# Patient Record
Sex: Female | Born: 1985 | Race: White | Hispanic: No | Marital: Single | Smoking: Former smoker
Health system: Southern US, Community
[De-identification: ages and names within clinical notes are randomized; demographics above are authoritative.]

## PROBLEM LIST (undated history)

## (undated) DIAGNOSIS — F419 Anxiety disorder, unspecified: Secondary | ICD-10-CM

## (undated) DIAGNOSIS — F32A Depression, unspecified: Secondary | ICD-10-CM

## (undated) DIAGNOSIS — B1920 Unspecified viral hepatitis C without hepatic coma: Secondary | ICD-10-CM

## (undated) DIAGNOSIS — F329 Major depressive disorder, single episode, unspecified: Secondary | ICD-10-CM

## (undated) DIAGNOSIS — F909 Attention-deficit hyperactivity disorder, unspecified type: Secondary | ICD-10-CM

## (undated) DIAGNOSIS — T8484XA Pain due to internal orthopedic prosthetic devices, implants and grafts, initial encounter: Secondary | ICD-10-CM

## (undated) DIAGNOSIS — Z87898 Personal history of other specified conditions: Secondary | ICD-10-CM

## (undated) HISTORY — PX: KNEE ARTHROSCOPY: SHX127

---
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
## 2005-08-05 ENCOUNTER — Inpatient Hospital Stay (HOSPITAL_COMMUNITY): Admission: AD | Admit: 2005-08-05 | Discharge: 2005-08-05 | Payer: Self-pay | Admitting: Obstetrics and Gynecology

## 2009-01-24 ENCOUNTER — Ambulatory Visit (HOSPITAL_BASED_OUTPATIENT_CLINIC_OR_DEPARTMENT_OTHER): Admission: RE | Admit: 2009-01-24 | Discharge: 2009-01-24 | Payer: Self-pay | Admitting: Orthopedic Surgery

## 2009-01-24 HISTORY — PX: ORIF ANKLE FRACTURE: SUR919

## 2009-08-13 ENCOUNTER — Emergency Department (HOSPITAL_COMMUNITY): Admission: EM | Admit: 2009-08-13 | Discharge: 2009-08-13 | Payer: Self-pay | Admitting: Emergency Medicine

## 2010-01-08 ENCOUNTER — Emergency Department (HOSPITAL_COMMUNITY)
Admission: EM | Admit: 2010-01-08 | Discharge: 2010-01-08 | Payer: Self-pay | Source: Home / Self Care | Admitting: Emergency Medicine

## 2010-05-14 LAB — POCT HEMOGLOBIN-HEMACUE: Hemoglobin: 13.7 g/dL (ref 12.0–15.0)

## 2010-05-20 ENCOUNTER — Emergency Department (HOSPITAL_BASED_OUTPATIENT_CLINIC_OR_DEPARTMENT_OTHER)
Admission: EM | Admit: 2010-05-20 | Discharge: 2010-05-20 | Disposition: A | Discharge status: Home / Self Care | Payer: Medicaid Other | Attending: Emergency Medicine | Admitting: Emergency Medicine

## 2010-05-20 DIAGNOSIS — Y92009 Unspecified place in unspecified non-institutional (private) residence as the place of occurrence of the external cause: Secondary | ICD-10-CM | POA: Insufficient documentation

## 2010-05-20 DIAGNOSIS — S0100XA Unspecified open wound of scalp, initial encounter: Secondary | ICD-10-CM | POA: Insufficient documentation

## 2010-05-27 ENCOUNTER — Inpatient Hospital Stay (HOSPITAL_COMMUNITY)
Admission: AD | Admit: 2010-05-27 | Discharge: 2010-05-27 | Disposition: A | Discharge status: Home / Self Care | Payer: Medicaid Other | Source: Ambulatory Visit | Attending: Obstetrics & Gynecology | Admitting: Obstetrics & Gynecology

## 2010-05-27 DIAGNOSIS — B373 Candidiasis of vulva and vagina: Secondary | ICD-10-CM | POA: Insufficient documentation

## 2010-05-27 DIAGNOSIS — N949 Unspecified condition associated with female genital organs and menstrual cycle: Secondary | ICD-10-CM | POA: Insufficient documentation

## 2010-05-27 DIAGNOSIS — B3731 Acute candidiasis of vulva and vagina: Secondary | ICD-10-CM | POA: Insufficient documentation

## 2010-05-27 LAB — URINALYSIS, ROUTINE W REFLEX MICROSCOPIC
Hgb urine dipstick: NEGATIVE
Nitrite: NEGATIVE
Protein, ur: NEGATIVE mg/dL
Specific Gravity, Urine: 1.015 (ref 1.005–1.030)
Urobilinogen, UA: 1 mg/dL (ref 0.0–1.0)

## 2010-05-27 LAB — POCT PREGNANCY, URINE: Preg Test, Ur: NEGATIVE

## 2010-05-27 LAB — WET PREP, GENITAL: Yeast Wet Prep HPF POC: NONE SEEN

## 2011-05-20 ENCOUNTER — Inpatient Hospital Stay (HOSPITAL_COMMUNITY)
Admission: AD | Admit: 2011-05-20 | Discharge: 2011-05-21 | Disposition: A | Discharge status: Home / Self Care | Payer: Medicaid Other | Attending: Obstetrics & Gynecology | Admitting: Obstetrics & Gynecology

## 2011-05-20 DIAGNOSIS — Z1389 Encounter for screening for other disorder: Secondary | ICD-10-CM

## 2011-05-20 DIAGNOSIS — O26899 Other specified pregnancy related conditions, unspecified trimester: Secondary | ICD-10-CM

## 2011-05-20 DIAGNOSIS — Z3490 Encounter for supervision of normal pregnancy, unspecified, unspecified trimester: Secondary | ICD-10-CM

## 2011-05-20 DIAGNOSIS — R109 Unspecified abdominal pain: Secondary | ICD-10-CM

## 2011-05-20 DIAGNOSIS — Z363 Encounter for antenatal screening for malformations: Secondary | ICD-10-CM

## 2011-05-20 DIAGNOSIS — O99891 Other specified diseases and conditions complicating pregnancy: Secondary | ICD-10-CM | POA: Insufficient documentation

## 2011-05-21 ENCOUNTER — Inpatient Hospital Stay (HOSPITAL_COMMUNITY): Payer: Medicaid Other

## 2011-05-21 ENCOUNTER — Encounter (HOSPITAL_COMMUNITY): Payer: Self-pay | Admitting: *Deleted

## 2011-05-21 LAB — WET PREP, GENITAL: Yeast Wet Prep HPF POC: NONE SEEN

## 2011-05-21 LAB — URINALYSIS, ROUTINE W REFLEX MICROSCOPIC
Glucose, UA: NEGATIVE mg/dL
Hgb urine dipstick: NEGATIVE
Leukocytes, UA: NEGATIVE
Specific Gravity, Urine: 1.02 (ref 1.005–1.030)
pH: 6 (ref 5.0–8.0)

## 2011-05-21 LAB — CBC
MCV: 90.4 fL (ref 78.0–100.0)
Platelets: 184 10*3/uL (ref 150–400)
RBC: 3.96 MIL/uL (ref 3.87–5.11)
RDW: 12.7 % (ref 11.5–15.5)
WBC: 6.6 10*3/uL (ref 4.0–10.5)

## 2011-05-21 LAB — POCT PREGNANCY, URINE
Preg Test, Ur: POSITIVE — AB
Preg Test, Ur: POSITIVE — AB

## 2011-05-21 LAB — ABO/RH: ABO/RH(D): A POS

## 2011-05-21 NOTE — Discharge Instructions (Signed)
No smoking, no drugs, no alcohol.  Take a prenatal vitamin one by mouth every day.  Eat small frequent snacks to avoid nausea.  Begin prenatal care as soon as possible. Take Tylenol 325 mg 2 tablets by mouth every 4 hours if needed for pain. Drink at least 8 8-oz glasses of water every day. Return if your symptoms worsen.  [facility]atal [facility]    [facility]  [facility]  [phone]     Phone: [phone]          [facility]                      [facility]  @[place]     Phone: [phone]  Phone: [phone]         [facility]     Phone: [phone]  Phone: [phone]           [facility] @ [place]                hone: [phone]  Phone: [phone]         [facility] [address] Francoise Ceo      Phone: [phone]  Phone: [phone]         [facility]                Phone: [phone]  [facility]   Phone:[phone]    Family [address] [place])          Phone: [phone] [facility]   Phone: [phone]      ________________________________________     To schedule your Maternity Eligibility Appointment, please call [phone].  When you arrive for your appointment you must bring the following items or information listed below.  Your appointment will be rescheduled if you do not have these items or are 15 minutes late. If currently receiving Medicaid, you MUST bring: 1. Medicaid Card 2. Social Security Card 3. Picture ID 4. Proof of Pregnancy 5. Verification of current address if the address on Medicaid card is incorrect "postmarked mail" If not receiving Medicaid, you MUST bring: 1. Social Security Card 2. Picture ID 3. Birth Certificate (if available) Passport or *Green Card 4. Proof of Pregnancy 5. Verification of current address "postmarked  mail" for each income presented. 6. Verification of insurance coverage, if any 7. Check stubs from each employer for the previous month (if unable to present check stub  for each week, we will accept check stub for the first and last week ill the same month.) If you can't locate check stubs, you must bring a letter from the employer(s) and it must have the following information on letterhead, typed, in English: o name of company o company telephone number o how long been with the company, if less than one month o how much person earns per hour o how many hours per week work o the gross pay the person earned for the previous month If you are 26 years old or less, you do not have to bring proof of income unless you work or live with the father of the baby and at that time we will need proof of income from you and/or the father of the baby. Green Card recipients are eligible for Medicaid for Pregnant Women (MPW)

## 2011-05-21 NOTE — MAU Note (Signed)
Pt LMP 04/10/2011 having mid abd pain x 3days.

## 2011-05-21 NOTE — MAU Provider Note (Signed)
History     CSN: [id number]  Arrival date and time: 05/20/11 2358   First Provider Initiated Contact with Patient 05/21/11 0215      Chief Complaint  Patient presents with  . Abdominal Pain   HPI Kristina Chang 26 y.o. Pregnancy test is positive.  LMP 04-10-11.  Having low midline abdominal pain x 3 days.  Some better at present.  No bleeding today.  Had spotting on Saturday.  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  OB History    Grav Para Term Preterm Abortions TAB SAB Ect Mult Living   2 1 1       1       History reviewed. No pertinent past medical history.  Past Surgical History  Procedure Date  . Knee surgery   . Orif ankle fracture     History reviewed. No pertinent family history.  History  Substance Use Topics  . Smoking status: Current Everyday Smoker  . Smokeless tobacco: Not on file  . Alcohol Use: No    Allergies: No Known Allergies  Prescriptions prior to admission  Medication Sig Dispense Refill  . amphetamine-dextroamphetamine (ADDERALL XR) 20 MG 24 hr capsule Take 20 mg by mouth every morning.        Review of Systems  Gastrointestinal: Positive for abdominal pain. Negative for nausea and vomiting.  Genitourinary: Negative for dysuria.       Vaginal bleeding   Physical Exam   Blood pressure 111/57, pulse 81, temperature 99.1 F (37.3 C), temperature source Oral, resp. rate 16, height 5\' 11"  (1.803 m), weight 197 lb 9.6 oz (89.631 kg), last menstrual period 04/10/2011.  Physical Exam  Nursing note and vitals reviewed. Constitutional: She is oriented to person, place, and time. She appears well-developed and well-nourished.  HENT:  Head: Normocephalic.  Eyes: EOM are normal.  Neck: Neck supple.  GI: Soft. There is no tenderness.  Genitourinary:       Speculum exam: Vulva - negative Vagina - Small amount of tan discharge, no odor Cervix - No contact bleeding Bimanual exam: Cervix closed Uterus non  tender, 8 week size Adnexa non tender, no masses bilaterally GC/Chlam, wet prep done Chaperone present for exam.  Musculoskeletal: Normal range of motion.  Neurological: She is alert and oriented to person, place, and time.  Skin: Skin is warm and dry.  Psychiatric: She has a normal mood and affect.    MAU Course  Procedures Results for orders placed during the hospital encounter of 05/20/11 (from the past 24 hour(s))  URINALYSIS, ROUTINE W REFLEX MICROSCOPIC     Status: Normal   Collection Time   05/21/11 12:32 AM      Component Value Range   Color, Urine YELLOW  YELLOW    APPearance CLEAR  CLEAR    Specific Gravity, Urine 1.020  1.005 - 1.030    pH 6.0  5.0 - 8.0    Glucose, UA NEGATIVE  NEGATIVE (mg/dL)   Hgb urine dipstick NEGATIVE  NEGATIVE    Bilirubin Urine NEGATIVE  NEGATIVE    Ketones, ur NEGATIVE  NEGATIVE (mg/dL)   Protein, ur NEGATIVE  NEGATIVE (mg/dL)   Urobilinogen, UA 0.2  0.0 - 1.0 (mg/dL)   Nitrite NEGATIVE  NEGATIVE    Leukocytes, UA NEGATIVE  NEGATIVE   POCT PREGNANCY, URINE     Status: Normal   Collection Time   05/21/11 12:54 AM      Component Value Range   Preg Test, Ur  NEGATIVE  NEGATIVE   POCT PREGNANCY, URINE     Status: Abnormal   Collection Time   05/21/11 12:58 AM      Component Value Range   Preg Test, Ur POSITIVE (*) NEGATIVE   HCG, QUANTITATIVE, PREGNANCY     Status: Abnormal   Collection Time   05/21/11  2:20 AM      Component Value Range   hCG, Beta Chain, Quant, S 2217 (*) <5 (mIU/mL)  CBC     Status: Abnormal   Collection Time   05/21/11  2:20 AM      Component Value Range   WBC 6.6  4.0 - 10.5 (K/uL)   RBC 3.96  3.87 - 5.11 (MIL/uL)   Hemoglobin 11.6 (*) 12.0 - 15.0 (g/dL)   HCT 38.7 (*) 56.4 - 46.0 (%)   MCV 90.4  78.0 - 100.0 (fL)   MCH 29.3  26.0 - 34.0 (pg)   MCHC 32.4  30.0 - 36.0 (g/dL)   RDW 33.2  95.1 - 88.4 (%)   Platelets 184  150 - 400 (K/uL)  ABO/RH     Status: Normal   Collection Time   05/21/11  2:20 AM       Component Value Range   ABO/RH(D) A POS    WET PREP, GENITAL     Status: Abnormal   Collection Time   05/21/11  3:05 AM      Component Value Range   Yeast Wet Prep HPF POC NONE SEEN  NONE SEEN    Trich, Wet Prep NONE SEEN  NONE SEEN    Clue Cells Wet Prep HPF POC FEW (*) NONE SEEN    WBC, Wet Prep HPF POC FEW (*) NONE SEEN    MDM  OBSTETRIC <14 WK ULTRASOUND  Technique: Transabdominal ultrasound was performed for evaluation  of the gestation as well as the maternal uterus and adnexal  regions.  Comparison: Prior ultrasound of pregnancy performed 05/12/2004  Intrauterine gestational sac: Visualized/normal in shape.  Yolk sac: Yes  Embryo: Yes  Cardiac Activity: Yes  Heart Rate: 156 bpm  CRL: 3.5 mm 6w 0d [place] EDC: 01/14/2012  Maternal uterus/Adnexae:  No subchorionic hemorrhage is noted. The uterus is otherwise  unremarkable in appearance.  The ovaries are within normal limits. The right ovary measures 3.6  x 1.8 x 2.2 cm, while the left ovary measures 2.8 x 2.0 x 1.6 cm.  No suspicious adnexal masses are seen; there is no evidence for  ovarian torsion.  No free fluid is seen within the pelvic cul-de-sac.  IMPRESSION:  Single live intrauterine pregnancy noted, with a crown-rump length  of 3.5 mm, corresponding to a gestational age of [redacted] weeks 0 days.  This matches the gestational age of [redacted] weeks 6 days by LMP,  reflecting an estimated date of delivery of January 15, 2012.   Assessment and Plan  IUP  Plan:  No smoking, no drugs, no alcohol.  Take a prenatal vitamin one by mouth every day.  Eat small frequent snacks to avoid nausea.  Begin prenatal care as soon as possible. Take Tylenol 325 mg 2 tablets by mouth every 4 hours if needed for pain. Drink at least 8 8-oz glasses of water every day.   [name] 05/21/2011, 2:18 AM

## 2011-05-22 LAB — GC/CHLAMYDIA PROBE AMP, GENITAL: Chlamydia, DNA Probe: NEGATIVE

## 2012-05-10 IMAGING — US US OB COMP LESS 14 WK
1 series · 14 of 28 positions shown · non-contrast
Comparison: Prior ultrasound of pregnancy performed 05/12/2004

CLINICAL DATA: Abdominal pain and pelvic cramping.

OBSTETRIC <14 WK ULTRASOUND
TECHNIQUE: Transabdominal ultrasound was performed for evaluation
of the gestation as well as the maternal uterus and adnexal
regions.

[Series 1: us ob comp less 14 wks · 14 of 30 slices shown]
[im 2/30]
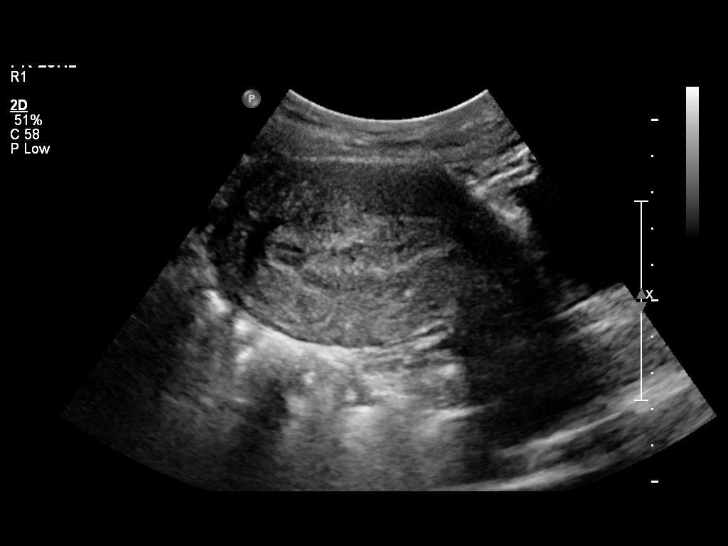
[im 4/30]
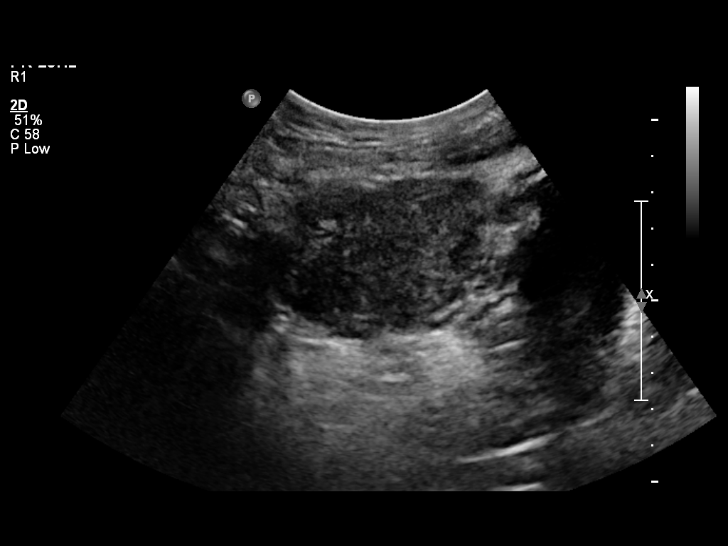
[im 6/30]
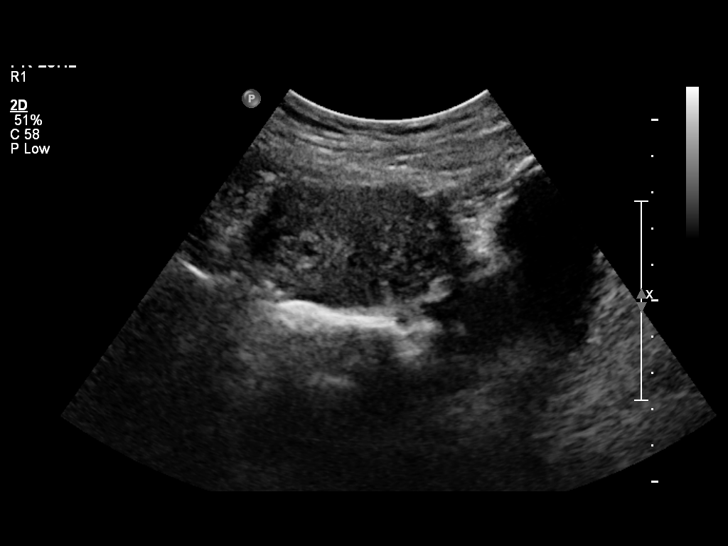
[im 8/30]
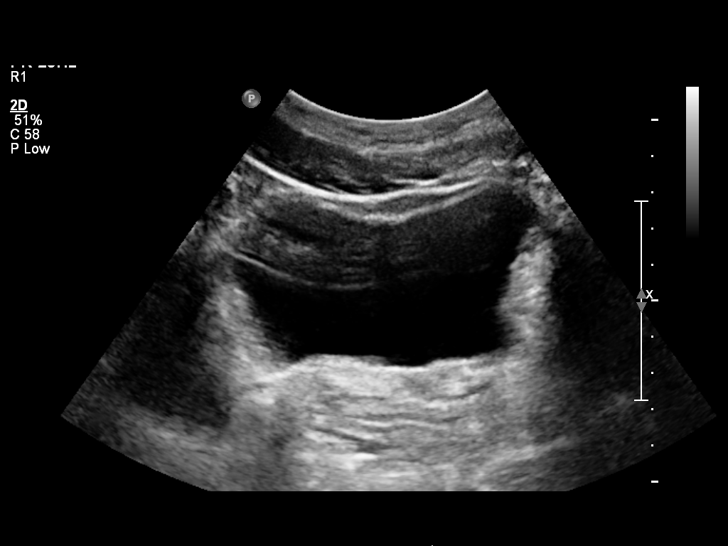
[im 10/30]
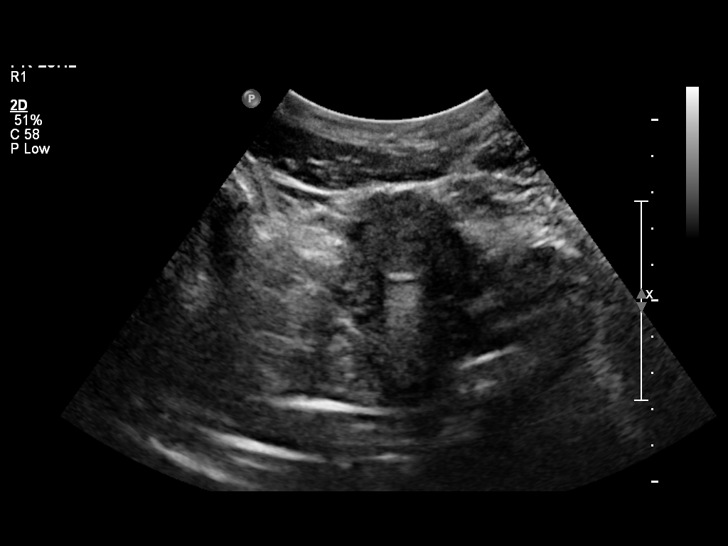
[im 12/30]
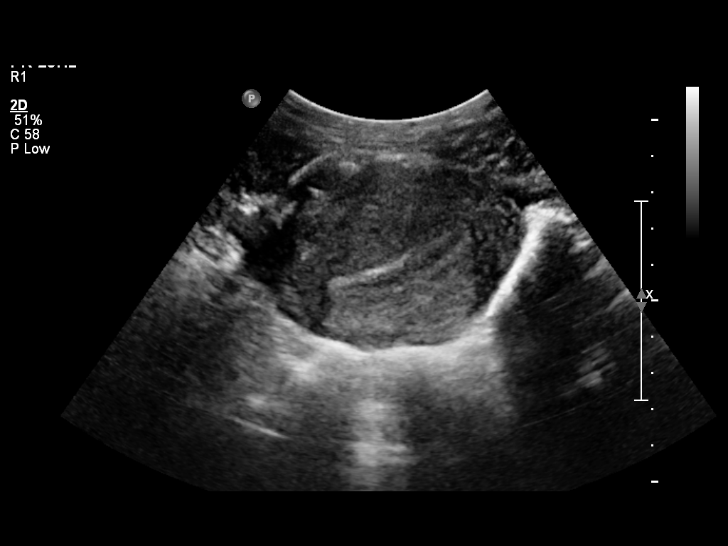
[im 14/30]
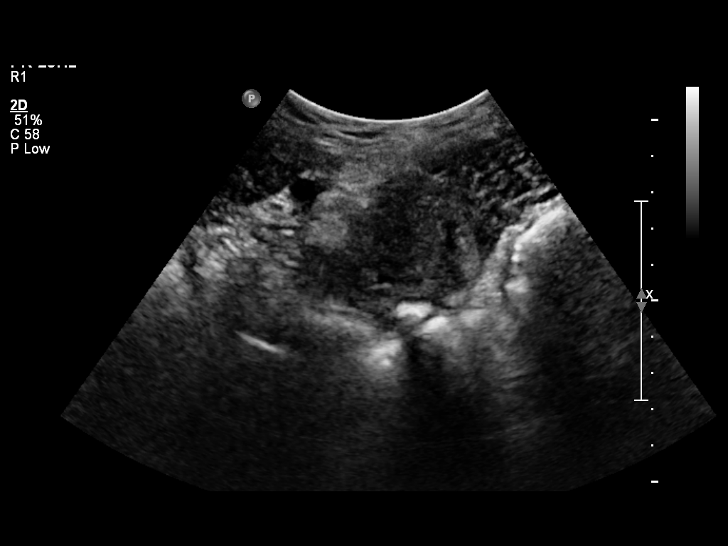
[im 17/30]
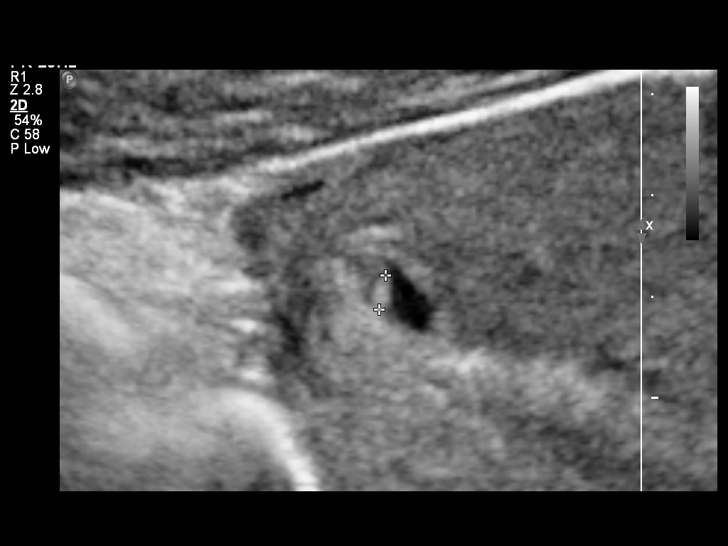
[im 19/30]
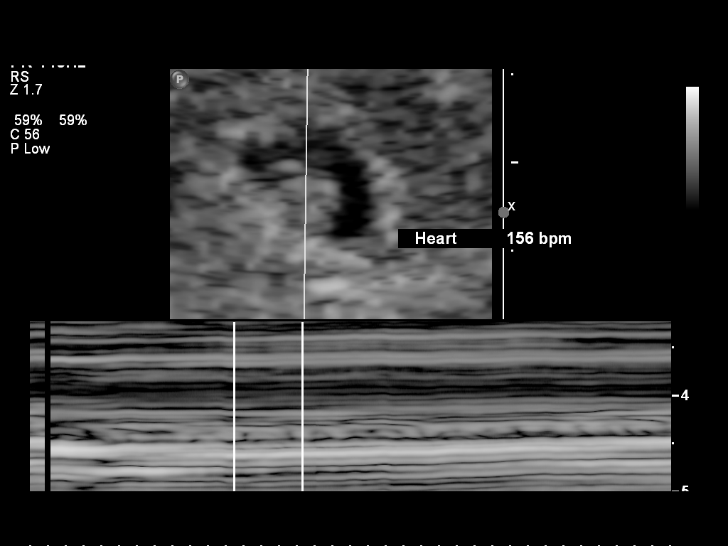
[im 21/30]
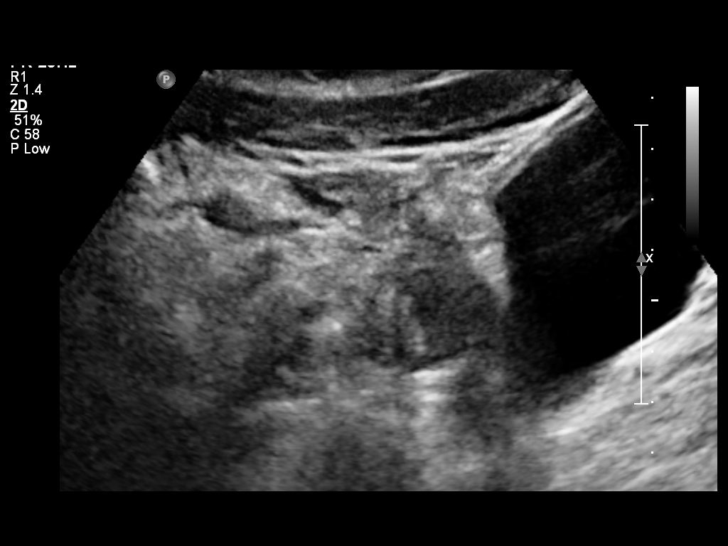
[im 23/30]
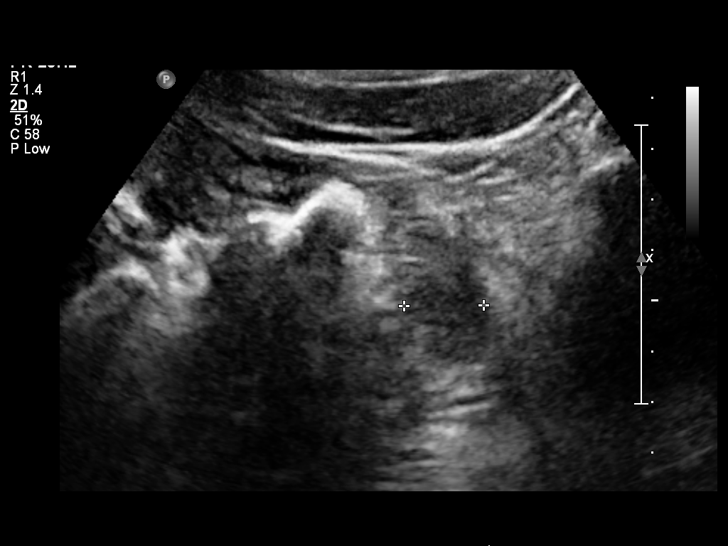
[im 25/30]
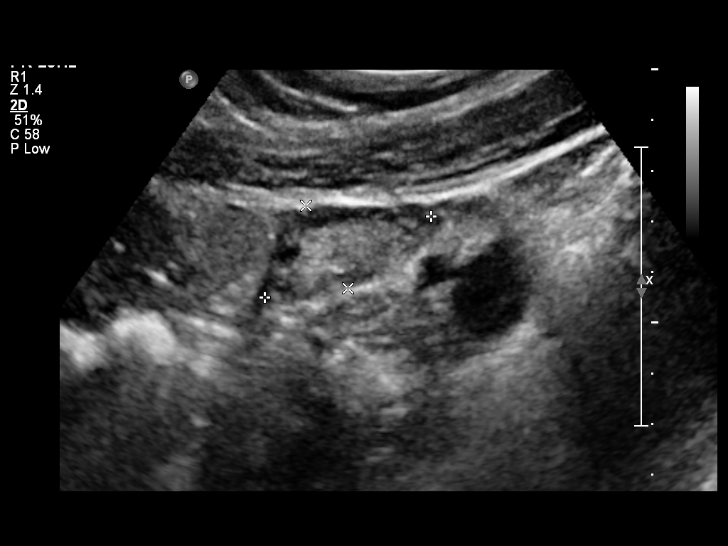
[im 27/30]
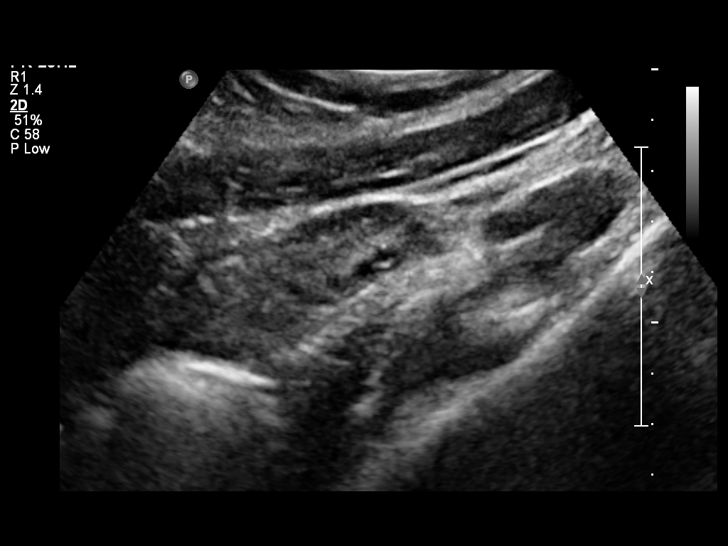
[im 30/30]
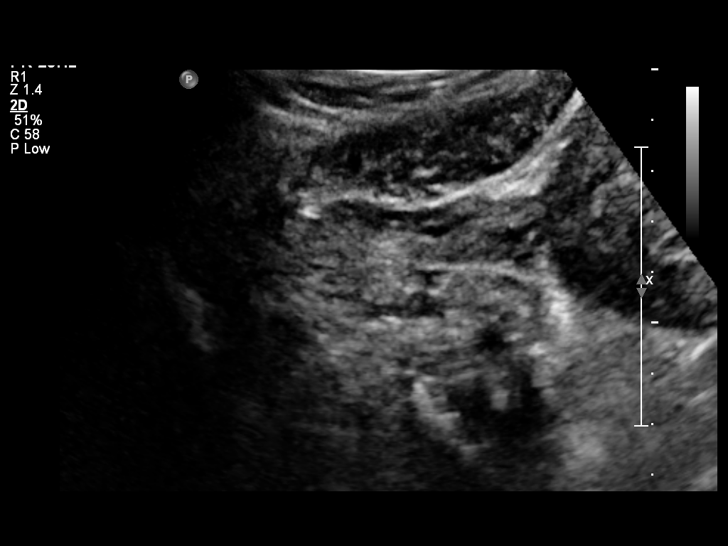

[14 of 28 positions shown; findings below may reference images not displayed]

Intrauterine gestational sac: Visualized/normal in shape.
Yolk sac: Yes
Embryo: Yes
Cardiac Activity: Yes
Heart Rate: 156 bpm

CRL:  3.5 mm  6w  0d          US EDC: 01/14/2012

Maternal uterus/Adnexae:
No subchorionic hemorrhage is noted.  The uterus is otherwise
unremarkable in appearance.

The ovaries are within normal limits.  The right ovary measures
x 1.8 x 2.2 cm, while the left ovary measures 2.8 x 2.0 x 1.6 cm.
No suspicious adnexal masses are seen; there is no evidence for
ovarian torsion.

No free fluid is seen within the pelvic cul-de-sac.
IMPRESSION: Single live intrauterine pregnancy noted, with a crown-rump length
of 3.5 mm, corresponding to a gestational age of 6 weeks 0 days.
This matches the gestational age of 5 weeks 6 days by LMP,
reflecting an estimated date of delivery January 15, 2012.

## 2013-12-12 ENCOUNTER — Encounter (HOSPITAL_COMMUNITY): Payer: Self-pay | Admitting: *Deleted

## 2014-04-25 ENCOUNTER — Inpatient Hospital Stay (HOSPITAL_COMMUNITY)
Admission: AD | Admit: 2014-04-25 | Discharge: 2014-04-25 | Disposition: A | Discharge status: Home / Self Care | Payer: Medicaid Other | Source: Ambulatory Visit | Attending: Obstetrics & Gynecology | Admitting: Obstetrics & Gynecology

## 2014-04-25 ENCOUNTER — Encounter (HOSPITAL_COMMUNITY): Payer: Self-pay

## 2014-04-25 DIAGNOSIS — N946 Dysmenorrhea, unspecified: Secondary | ICD-10-CM | POA: Insufficient documentation

## 2014-04-25 DIAGNOSIS — N921 Excessive and frequent menstruation with irregular cycle: Secondary | ICD-10-CM | POA: Diagnosis not present

## 2014-04-25 DIAGNOSIS — N920 Excessive and frequent menstruation with regular cycle: Secondary | ICD-10-CM | POA: Insufficient documentation

## 2014-04-25 DIAGNOSIS — Z3202 Encounter for pregnancy test, result negative: Secondary | ICD-10-CM | POA: Insufficient documentation

## 2014-04-25 DIAGNOSIS — F17210 Nicotine dependence, cigarettes, uncomplicated: Secondary | ICD-10-CM | POA: Insufficient documentation

## 2014-04-25 DIAGNOSIS — N939 Abnormal uterine and vaginal bleeding, unspecified: Secondary | ICD-10-CM | POA: Diagnosis present

## 2014-04-25 LAB — URINALYSIS, ROUTINE W REFLEX MICROSCOPIC
BILIRUBIN URINE: NEGATIVE
Glucose, UA: NEGATIVE mg/dL
Ketones, ur: NEGATIVE mg/dL
Nitrite: NEGATIVE
PH: 7 (ref 5.0–8.0)
Protein, ur: NEGATIVE mg/dL
SPECIFIC GRAVITY, URINE: 1.015 (ref 1.005–1.030)
Urobilinogen, UA: 1 mg/dL (ref 0.0–1.0)

## 2014-04-25 LAB — URINE MICROSCOPIC-ADD ON

## 2014-04-25 LAB — CBC WITH DIFFERENTIAL/PLATELET
BASOS PCT: 0 % (ref 0–1)
Basophils Absolute: 0 10*3/uL (ref 0.0–0.1)
EOS ABS: 0.1 10*3/uL (ref 0.0–0.7)
Eosinophils Relative: 1 % (ref 0–5)
HEMATOCRIT: 36.1 % (ref 36.0–46.0)
Hemoglobin: 11.8 g/dL — ABNORMAL LOW (ref 12.0–15.0)
Lymphocytes Relative: 33 % (ref 12–46)
Lymphs Abs: 1.8 10*3/uL (ref 0.7–4.0)
MCH: 28.6 pg (ref 26.0–34.0)
MCHC: 32.7 g/dL (ref 30.0–36.0)
MCV: 87.6 fL (ref 78.0–100.0)
MONO ABS: 0.4 10*3/uL (ref 0.1–1.0)
Monocytes Relative: 8 % (ref 3–12)
NEUTROS ABS: 3.2 10*3/uL (ref 1.7–7.7)
Neutrophils Relative %: 58 % (ref 43–77)
Platelets: 151 10*3/uL (ref 150–400)
RBC: 4.12 MIL/uL (ref 3.87–5.11)
RDW: 14.7 % (ref 11.5–15.5)
WBC: 5.5 10*3/uL (ref 4.0–10.5)

## 2014-04-25 LAB — WET PREP, GENITAL
CLUE CELLS WET PREP: NONE SEEN
Trich, Wet Prep: NONE SEEN
Yeast Wet Prep HPF POC: NONE SEEN

## 2014-04-25 LAB — HCG, QUANTITATIVE, PREGNANCY

## 2014-04-25 LAB — POCT PREGNANCY, URINE: PREG TEST UR: NEGATIVE

## 2014-04-25 NOTE — MAU Provider Note (Signed)
History     CSN: [id number]  Arrival date and time: 04/25/14 1344   First Provider Initiated Contact with Patient 04/25/14 1456      Chief Complaint  Patient presents with  . Abdominal Pain  . Vaginal Bleeding   HPI  [name] is a 29 y.o. G2P1001 who presents to MAU today with complaint of heavy vaginal bleeding and possible miscarriage. The patient initially states that bleeding started 1 week ago and became heavy last night. She states that she has used ~ 10 tampons today. She states mild lower abdominal cramping similar to with her period. She states 2 episodes of N/V last night, but none since and she states fever or 102F last night, that resolved spontaneously. She states LMP of 01/15/15.   After initial exam and interview patient states that she had +UPT when she "got locked up" on 02/24/14 so she is sure that she was pregnant, but then had heavy bleeding with clots ~ 3 weeks ago. She did not seek medical care at that time. She states that she is here today because her mother told her to come and "make sure everything had passed."   OB History    Gravida Para Term Preterm AB TAB SAB Ectopic Multiple Living   History reviewed. No pertinent past medical history.  Past Surgical History  Procedure Laterality Date  . Knee surgery    . Orif ankle fracture      Family History  Problem Relation Age of Onset  . Diabetes Mother     History  Substance Use Topics  . Smoking status: Current Every Day Smoker -- 1.00 packs/day    Types: Cigarettes  . Smokeless tobacco: Never Used  . Alcohol Use: No    Allergies: No Known Allergies  No prescriptions prior to admission    Review of Systems  Constitutional: Positive for malaise/fatigue. Negative for fever.  Gastrointestinal: Positive for nausea, vomiting and abdominal pain. Negative for diarrhea and constipation.  Genitourinary: Negative for dysuria, urgency and frequency.       + vaginal  bleeding  Neurological: Positive for dizziness and weakness. Negative for loss of consciousness.   Physical Exam   Blood pressure 138/90, pulse 95, temperature 98.4 F (36.9 C), temperature source Oral, resp. rate 20, last menstrual period 01/14/2014.  Physical Exam  Constitutional: She is oriented to person, place, and time. She appears well-developed and well-nourished. No distress.  HENT:  Head: Normocephalic.  Cardiovascular: Normal rate.   Respiratory: Effort normal.  Genitourinary: Uterus is not enlarged and not tender. Cervix exhibits no motion tenderness, no discharge and no friability. Right adnexum displays no mass and no tenderness. Left adnexum displays no mass and no tenderness. There is bleeding (scant dark blood in vagina) in the vagina. No vaginal discharge found.  Cervix: closed, thick, posterior  Neurological: She is alert and oriented to person, place, and time.  Skin: Skin is warm and dry. No erythema.  Psychiatric: She has a normal mood and affect.   Results for orders placed or performed during the hospital encounter of 04/25/14 (from the past 24 hour(s))  Urinalysis, Routine w reflex microscopic     Status: Abnormal   Collection Time: 04/25/14  2:10 PM  Result Value Ref Range   Color, Urine YELLOW YELLOW   APPearance CLEAR CLEAR   Specific Gravity, Urine 1.015 1.005 - 1.030   pH 7.0 5.0 -  8.0   Glucose, UA NEGATIVE NEGATIVE mg/dL   Hgb urine dipstick MODERATE (A) NEGATIVE   Bilirubin Urine NEGATIVE NEGATIVE   Ketones, ur NEGATIVE NEGATIVE mg/dL   Protein, ur NEGATIVE NEGATIVE mg/dL   Urobilinogen, UA 1.0 0.0 - 1.0 mg/dL   Nitrite NEGATIVE NEGATIVE   Leukocytes, UA TRACE (A) NEGATIVE  Urine microscopic-add on     Status: Abnormal   Collection Time: 04/25/14  2:10 PM  Result Value Ref Range   Squamous Epithelial / LPF RARE RARE   WBC, UA 3-6 <3 WBC/hpf   RBC / HPF 0-2 <3 RBC/hpf   Bacteria, UA FEW (A) RARE   Urine-Other MUCOUS PRESENT   Wet prep,  genital     Status: Abnormal   Collection Time: 04/25/14  3:12 PM  Result Value Ref Range   Yeast Wet Prep HPF POC NONE SEEN NONE SEEN   Trich, Wet Prep NONE SEEN NONE SEEN   Clue Cells Wet Prep HPF POC NONE SEEN NONE SEEN   WBC, Wet Prep HPF POC FEW (A) NONE SEEN  Pregnancy, urine POC     Status: None   Collection Time: 04/25/14  3:24 PM  Result Value Ref Range   Preg Test, Ur NEGATIVE NEGATIVE  hCG, quantitative, pregnancy     Status: None   Collection Time: 04/25/14  4:28 PM  Result Value Ref Range   hCG, Beta Chain, Quant, S <1 <5 mIU/mL  CBC with Differential/Platelet     Status: Abnormal   Collection Time: 04/25/14  4:28 PM  Result Value Ref Range   WBC 5.5 4.0 - 10.5 K/uL   RBC 4.12 3.87 - 5.11 MIL/uL   Hemoglobin 11.8 (L) 12.0 - 15.0 g/dL   HCT 16.136.1 09.636.0 - 04.546.0 %   MCV 87.6 78.0 - 100.0 fL   MCH 28.6 26.0 - 34.0 pg   MCHC 32.7 30.0 - 36.0 g/dL   RDW 40.914.7 81.111.5 - 91.415.5 %   Platelets 151 150 - 400 K/uL   Neutrophils Relative % 58 43 - 77 %   Neutro Abs 3.2 1.7 - 7.7 K/uL   Lymphocytes Relative 33 12 - 46 %   Lymphs Abs 1.8 0.7 - 4.0 K/uL   Monocytes Relative 8 3 - 12 %   Monocytes Absolute 0.4 0.1 - 1.0 K/uL   Eosinophils Relative 1 0 - 5 %   Eosinophils Absolute 0.1 0.0 - 0.7 K/uL   Basophils Relative 0 0 - 1 %   Basophils Absolute 0.0 0.0 - 0.1 K/uL    MAU Course  Procedures None  MDM UPT - negative UA, wet prep, GC/Chlamydia, CBC, quant hCG, HIV and RPR today Will get quant hCG to confirm complete SAB last month. Bleeding this week is more likely return of menses after SAB.   Assessment and Plan  A: Menorrhagia Dysmenorrhea Negative pregnancy test  P: Discharge home Bleeding precautions discussed Patient advised to follow-up with PCP or GYN of choice PRN Patient may return to MAU as needed or if her condition were to change or worsen   Marny LowensteinJulie N Wenzel, PA-C  04/25/2014, 8:16 PM

## 2014-04-25 NOTE — MAU Note (Signed)
Pt states she had a fever, cough, cold, runny nose.  Pos UPT in January, started bleeding last week, it stopped, started again 1-2 days ago, lightheaded, dizzy.

## 2014-04-25 NOTE — Discharge Instructions (Signed)
Menstruation °Menstruation is the monthly passing of blood, tissue, fluid and mucus, also know as a period. Your body is shedding the lining of the uterus. The flow, or amount of blood, usually lasts from 3-7 days each month. Hormones control the menstrual cycle. Hormones are a chemical substance produced by endocrine glands in the body to regulate different bodily functions. °The first menstrual period may start any time between age 29 years to 16 years. However, it usually starts around age 12 years. Some girls have regular monthly menstrual cycles right from the beginning. However, it is not unusual to have only a couple of drops of blood or spotting when you first start menstruating. It is also not unusual to have two periods a month or miss a month or two when first starting your periods. °SYMPTOMS  °· Mild to moderate abdominal cramps. °· Aching or pain in the lower back area. °Symptoms may occur 5-10 days before your menstrual period starts. These symptoms are referred to as premenstrual syndrome (PMS). These symptoms can include: °· Headache. °· Breast tenderness and swelling. °· Bloating. °· Tiredness (fatigue). °· Mood changes. °· Craving for certain foods. °These are normal signs and symptoms and can vary in severity. To help relieve these problems, ask your caregiver if you can take over-the-counter medications for pain or discomfort. If the symptoms are not controllable, see your caregiver for help.  °HORMONES INVOLVED IN MENSTRUATION °Menstruation comes about because of hormones produced by the pituitary gland in the brain and the ovaries that affect the uterine lining. °First, the pituitary gland in the brain produces the hormone follicle stimulating hormone (FSH). FSH stimulates the ovaries to produce estrogen, which thickens the uterine lining and begins to develop an egg in the ovary. About 14 days later, the pituitary gland produces another hormone called luteinizing hormone (LH). LH causes the egg  to come out of a sac in the ovary (ovulation). The empty sac on the ovary called the corpus luteum is stimulated by another hormone from the pituitary gland called luteotropin. The corpus luteum begins to produce the estrogen and progesterone hormone. The progesterone hormone prepares the lining of the uterus to have the fertilized egg (egg combined with sperm) attach to the lining of the uterus and begin to develop into a fetus. If the egg is not fertilized, the corpus luteum stops producing estrogen and progesterone, it disappears, the lining of the uterus sloughs off and a menstrual period begins. Then the menstrual cycle starts all over again and will continue monthly unless pregnancy occurs or menopause begins. °The secretion of hormones is complex. Various parts of the body become involved in many chemical activities. Female sex hormones have other functions in a woman's body as well. Estrogen increases a woman's sex drive (libido). It naturally helps body get rid of fluids (diuretic). It also aids in the process of building new bone. Therefore, maintaining hormonal health is essential to all levels of a woman's well being. These hormones are usually present in normal amounts and cause you to menstruate. It is the relationship between the (small) levels of the hormones that is critical. When the balance is upset, menstrual irregularities can occur. °HOW DOES THE MENSTRUAL CYCLE HAPPEN? °· Menstrual cycles vary in length from 21-35 days with an average of 29 days. The cycle begins on the first day of bleeding. At this time, the pituitary gland in the brain releases FSH that travels through the bloodstream to the ovaries. The FSH stimulates the follicles in the   ovaries. This prepares the body for ovulation that occurs around the 14th day of the cycle. The ovaries produce estrogen, and this makes sure conditions are right in the uterus for implantation of the fertilized egg. °· When the levels of estrogen reach a  high enough level, it signals the gland in the brain (pituitary gland) to release a surge of LH. This causes the release of the ripest egg from its follicle (ovulation). Usually only one follicle releases one egg, but sometimes more than one follicle releases an egg especially when stimulating the ovaries for in vitro fertilization. The egg can then be collected by either fallopian tube to await fertilization. The burst follicle within the ovary that is left behind is now called the corpus luteum or "yellow body." The corpus luteum continues to give off (secrete) reduced amounts of estrogen. This closes and hardens the cervix. It dries up the mucus to the naturally infertile condition. °· The corpus luteum also begins to give off greater amounts of progesterone. This causes the lining of the uterus (endometrium) to thicken even more in preparation for the fertilized egg. The egg is starting to journey down from the fallopian tube to the uterus. It also signals the ovaries to stop releasing eggs. It assists in returning the cervical mucus to its infertile state. °· If the egg implants successfully into the womb lining and pregnancy occurs, progesterone levels will continue to raise. It is often this hormone that gives some pregnant women a feeling of well being, like a "natural high." Progesterone levels drop again after childbirth. °· If fertilization does not occur, the corpus luteum dies, stopping the production of hormones. This sudden drop in progesterone causes the uterine lining to break down, accompanied by blood (menstruation). °· This starts the cycle back at day 1. The whole process starts all over again. Woman go through this cycle every month from puberty to menopause. Women have breaks only for pregnancy and breastfeeding (lactation), unless the woman has health problems that affect the female hormone system or chooses to use oral contraceptives to have unnatural menstrual periods. °HOME CARE  INSTRUCTIONS  °· Keep track of your periods by using a calendar. °· If you use tampons, get the least absorbent to avoid toxic shock syndrome. °· Do not leave tampons in the vagina over night or longer than 6 hours. °· Wear a sanitary pad over night. °· Exercise 3-5 times a week or more. °· Avoid foods and drinks that you know will make your symptoms worse before or during your period. °SEEK MEDICAL CARE IF:  °· You develop a fever with your period. °· Your periods are lasting more than 7 days. °· Your period is so heavy that you have to change pads or tampons every 30 minutes. °· You develop clots with your period and never had clots before. °· You cannot get relief from over-the-counter medication for your symptoms. °· Your period has not started, and it has been longer than 35 days. °Document Released: 01/17/2002 Document Revised: 02/01/2013 Document Reviewed: 08/26/2012 °[employer]® Patient Information ©2015 [employer]. This information is not intended to replace advice given to you by your health care provider. Make sure you discuss any questions you have with your health care provider. ° °

## 2014-04-25 NOTE — MAU Note (Signed)
Pt explained that she had previously went to jail on January 15th, 2016. She had a positive pregnancy test while there. Pt stated she had "alot of bleeding and cramping" towards the end of February and believes this is when she "lost the baby"

## 2014-04-25 NOTE — MAU Note (Signed)
Pt called, not in lobby 

## 2014-04-26 LAB — RPR: RPR Ser Ql: NONREACTIVE

## 2014-04-26 LAB — GC/CHLAMYDIA PROBE AMP (~~LOC~~) NOT AT ARMC
Chlamydia: NEGATIVE
Neisseria Gonorrhea: NEGATIVE

## 2014-04-26 LAB — HIV ANTIBODY (ROUTINE TESTING W REFLEX): HIV SCREEN 4TH GENERATION: NONREACTIVE

## 2015-04-16 ENCOUNTER — Inpatient Hospital Stay (HOSPITAL_COMMUNITY)
Admission: AD | Admit: 2015-04-16 | Discharge: 2015-04-16 | Disposition: A | Discharge status: Home / Self Care | Payer: Medicaid Other | Source: Ambulatory Visit | Attending: Family Medicine | Admitting: Family Medicine

## 2015-04-16 ENCOUNTER — Encounter (HOSPITAL_COMMUNITY): Payer: Self-pay | Admitting: *Deleted

## 2015-04-16 DIAGNOSIS — Z7982 Long term (current) use of aspirin: Secondary | ICD-10-CM | POA: Insufficient documentation

## 2015-04-16 DIAGNOSIS — F17210 Nicotine dependence, cigarettes, uncomplicated: Secondary | ICD-10-CM | POA: Diagnosis not present

## 2015-04-16 DIAGNOSIS — L02413 Cutaneous abscess of right upper limb: Secondary | ICD-10-CM | POA: Diagnosis not present

## 2015-04-16 DIAGNOSIS — Z3202 Encounter for pregnancy test, result negative: Secondary | ICD-10-CM | POA: Diagnosis not present

## 2015-04-16 DIAGNOSIS — L02419 Cutaneous abscess of limb, unspecified: Secondary | ICD-10-CM

## 2015-04-16 DIAGNOSIS — M7989 Other specified soft tissue disorders: Secondary | ICD-10-CM | POA: Diagnosis present

## 2015-04-16 LAB — URINALYSIS, ROUTINE W REFLEX MICROSCOPIC
Bilirubin Urine: NEGATIVE
Glucose, UA: NEGATIVE mg/dL
Hgb urine dipstick: NEGATIVE
Ketones, ur: NEGATIVE mg/dL
NITRITE: NEGATIVE
PH: 6 (ref 5.0–8.0)
Protein, ur: NEGATIVE mg/dL
Specific Gravity, Urine: 1.025 (ref 1.005–1.030)

## 2015-04-16 LAB — POCT PREGNANCY, URINE: Preg Test, Ur: NEGATIVE

## 2015-04-16 LAB — URINE MICROSCOPIC-ADD ON: RBC / HPF: NONE SEEN RBC/hpf (ref 0–5)

## 2015-04-16 MED ORDER — SULFAMETHOXAZOLE-TRIMETHOPRIM 800-160 MG PO TABS: 1.0000 | ORAL_TABLET | Freq: Two times a day (BID) | ORAL | Status: AC

## 2015-04-16 NOTE — MAU Note (Signed)
Pt has a 2.5 X 4.5 cm raised, red and warm to touch area on right anterior forearm. Pt reports having a fever last night that she did not take with thermometer.  Says it "broke " at 0300 this morning.

## 2015-04-16 NOTE — MAU Note (Signed)
Pt reports her hands and face keep swelling for the last 2 days, reports no period since 01/2015 but preg test are negative.

## 2015-04-16 NOTE — Discharge Instructions (Signed)

## 2015-04-16 NOTE — MAU Provider Note (Signed)
History     CSN: [id number]  Arrival date and time: 04/16/15 2044   First Provider Initiated Contact with Patient 04/16/15 2124      Chief Complaint  Patient presents with  . Arm Swelling   HPI Comments: Kristina Chang is a 30 y.o. G2P1011 who presents today with swelling. She states that she has been itching all over x 2 days. She states that she also has a large bump on her arm as well. She has not taken anything for any of these symptoms. She states that she just was released from jail. She states that the bump on her arm hurts. She rates that pain 7/10.  Arm Pain  The incident occurred 2 days ago. The incident occurred at home. There was no injury mechanism. The pain is present in the right forearm. The quality of the pain is described as aching. The pain does not radiate. The pain is at a severity of 7/10. The pain has been constant since the incident. Pertinent negatives include no chest pain, muscle weakness, numbness or tingling. Nothing aggravates the symptoms. She has tried nothing for the symptoms.    Past Medical History  Diagnosis Date  . Infection     had boils on her arm and leg and had them I & D    Past Surgical History  Procedure Laterality Date  . Knee surgery    . Orif ankle fracture    . Abcess drainage      arm and leg    Family History  Problem Relation Age of Onset  . Diabetes Mother     Social History  Substance Use Topics  . Smoking status: Current Every Day Smoker -- 1.00 packs/day    Types: Cigarettes  . Smokeless tobacco: Never Used  . Alcohol Use: No    Allergies: No Known Allergies  Prescriptions prior to admission  Medication Sig Dispense Refill Last Dose  . amoxicillin (AMOXIL) 500 MG capsule Take 1,000 mg by mouth once. Said she took two of her boyfriends antibiotics today to try to help the red bump on her arm.   04/16/2015 at Unknown time  . Aspirin-Acetaminophen-Caffeine (GOODY HEADACHE PO) Take 2 Packages by mouth daily as  needed (headache).   04/16/2015 at Unknown time  . ibuprofen (ADVIL,MOTRIN) 200 MG tablet Take 400-600 mg by mouth every 6 (six) hours as needed for mild pain (depends on pain level).   Past Month at Unknown time    Review of Systems  Constitutional: Negative for fever and chills.  Cardiovascular: Negative for chest pain.  Gastrointestinal: Positive for nausea. Negative for vomiting, abdominal pain, diarrhea and constipation.  Genitourinary: Negative for dysuria, urgency and frequency.  Neurological: Negative for tingling and numbness.   Physical Exam   Blood pressure 142/74, pulse 87, temperature 98.3 F (36.8 C), temperature source Oral, resp. rate 16, last menstrual period 01/30/2015, SpO2 100 %.  Physical Exam  Nursing note and vitals reviewed. Constitutional: She is oriented to person, place, and time. She appears well-developed and well-nourished. No distress.  HENT:  Head: Normocephalic.  Cardiovascular: Normal rate.   Respiratory: Effort normal.  GI: Soft.  Neurological: She is alert and oriented to person, place, and time.  Skin: Skin is warm and dry.  3cmx3cm red raised area on right forearm, c/w abscess, but no open wound present.   Psychiatric: She has a normal mood and affect.   Results for orders placed or performed during the hospital encounter of 04/16/15 (from the  past 24 hour(s))  Pregnancy, urine POC     Status: None   Collection Time: 04/16/15  9:05 PM  Result Value Ref Range   Preg Test, Ur NEGATIVE NEGATIVE    MAU Course  Procedures  MDM   Assessment and Plan   1. Abscess of arm    DC home Comfort measures reviewed  RX: bactrim BID x 10 days    Follow-up Information    Follow [facility] with [facility]   Specialty:  Urgent Care   Why:  If symptoms worsen   Contact information:   [address] [place] [id number] [phone]        Tawnya CrookHogan, Rishith Siddoway Donovan 04/16/2015, 9:29 PM

## 2015-07-02 ENCOUNTER — Other Ambulatory Visit: Payer: Self-pay | Admitting: Orthopedic Surgery

## 2015-07-11 ENCOUNTER — Encounter (HOSPITAL_BASED_OUTPATIENT_CLINIC_OR_DEPARTMENT_OTHER): Payer: Self-pay | Admitting: *Deleted

## 2015-07-19 ENCOUNTER — Encounter (HOSPITAL_BASED_OUTPATIENT_CLINIC_OR_DEPARTMENT_OTHER): Payer: Self-pay | Admitting: Certified Registered"

## 2015-07-19 ENCOUNTER — Ambulatory Visit (HOSPITAL_BASED_OUTPATIENT_CLINIC_OR_DEPARTMENT_OTHER): Admission: RE | Admit: 2015-07-19 | Payer: Medicaid Other | Source: Ambulatory Visit | Admitting: Orthopedic Surgery

## 2015-07-19 SURGERY — REMOVAL, HARDWARE
Anesthesia: General | Site: Ankle | Laterality: Left

## 2015-07-19 NOTE — Anesthesia Preprocedure Evaluation (Deleted)
Anesthesia Evaluation    Airway        Dental   Pulmonary former smoker,          Cardiovascular     Neuro/Psych    GI/Hepatic   Endo/Other    Renal/GU      Musculoskeletal   Abdominal   Peds  Hematology   Anesthesia Other Findings   Reproductive/Obstetrics                             Anesthesia Physical Anesthesia Plan Anesthesia Quick Evaluation  

## 2015-08-08 ENCOUNTER — Encounter (HOSPITAL_COMMUNITY): Payer: Self-pay | Admitting: Emergency Medicine

## 2015-08-08 ENCOUNTER — Emergency Department (HOSPITAL_COMMUNITY)
Admission: EM | Admit: 2015-08-08 | Discharge: 2015-08-09 | Disposition: A | Discharge status: Home / Self Care | Payer: Medicaid Other | Attending: Emergency Medicine | Admitting: Emergency Medicine

## 2015-08-08 DIAGNOSIS — Z791 Long term (current) use of non-steroidal anti-inflammatories (NSAID): Secondary | ICD-10-CM | POA: Insufficient documentation

## 2015-08-08 DIAGNOSIS — Z7982 Long term (current) use of aspirin: Secondary | ICD-10-CM | POA: Diagnosis not present

## 2015-08-08 DIAGNOSIS — F909 Attention-deficit hyperactivity disorder, unspecified type: Secondary | ICD-10-CM | POA: Diagnosis not present

## 2015-08-08 DIAGNOSIS — F1110 Opioid abuse, uncomplicated: Secondary | ICD-10-CM | POA: Insufficient documentation

## 2015-08-08 DIAGNOSIS — F319 Bipolar disorder, unspecified: Secondary | ICD-10-CM | POA: Insufficient documentation

## 2015-08-08 DIAGNOSIS — Z79899 Other long term (current) drug therapy: Secondary | ICD-10-CM | POA: Diagnosis not present

## 2015-08-08 DIAGNOSIS — R45851 Suicidal ideations: Secondary | ICD-10-CM | POA: Diagnosis present

## 2015-08-08 DIAGNOSIS — F17200 Nicotine dependence, unspecified, uncomplicated: Secondary | ICD-10-CM | POA: Insufficient documentation

## 2015-08-08 HISTORY — DX: Attention-deficit hyperactivity disorder, unspecified type: F90.9

## 2015-08-08 HISTORY — DX: Anxiety disorder, unspecified: F41.9

## 2015-08-08 HISTORY — DX: Major depressive disorder, single episode, unspecified: F32.9

## 2015-08-08 HISTORY — DX: Depression, unspecified: F32.A

## 2015-08-08 MED ORDER — LORAZEPAM 1 MG PO TABS
1.0000 mg | ORAL_TABLET | Freq: Once | ORAL | Status: AC
  Administered 2015-08-09: 1 mg via ORAL
  Filled 2015-08-08: qty 1

## 2015-08-08 NOTE — ED Notes (Signed)
Pt states she has been depressed for the past few months.  States nothing happened to trigger it.  SI without plan.  HI "sometimes" according to pt.

## 2015-08-08 NOTE — ED Notes (Signed)
Unsuccessful attempt to draw labs. Nurse informed. 

## 2015-08-08 NOTE — ED Provider Notes (Signed)
CSN: [id number]     Arrival date & time 08/08/15  2242 History  By signing my name below, I, Evon Slackerrance Branch, attest that this documentation has been prepared under the direction and in the presence of TRW AutomotiveKelly Bryar Dahms, PA-C. Electronically Signed: Evon Slackerrance Branch, ED Scribe. 08/08/2015. 11:53 PM.    Chief Complaint  Patient presents with  . Suicidal    The history is provided by the patient. No language interpreter was used.   HPI Comments: Kristina Chang is a 30 y.o. female who presents to the Emergency Department complaining of SI tonight PTA. Pt states that she has been feeling more depressed due to recent stressors in life. Pt reports that she has been anxious as well. Pt states that she feels that she needs to be placed back on her medications. Pt states that she was previously prescribed Wellbutrin and adderrall. Pt states that she has not been complaint with her medications due to not having insurance. Pt denies having plan to commit suicide. Pt reports Hx of bipolar depression. Pt does report Hx opioid abuse.    Past Medical History  Diagnosis Date  . Infection     had boils on her arm and leg and had them I & D  . ADHD (attention deficit hyperactivity disorder)   . Difficulty controlling anger   . Depression   . Anxiety    Past Surgical History  Procedure Laterality Date  . Knee surgery    . Orif ankle fracture    . Abcess drainage      arm and leg   Family History  Problem Relation Age of Onset  . Diabetes Mother    Social History  Substance Use Topics  . Smoking status: Current Every Day Smoker -- 1.00 packs/day    Last Attempt to Quit: 06/20/2015  . Smokeless tobacco: Never Used  . Alcohol Use: 3.0 oz/week    5 Cans of beer per week   OB History    Gravida Para Term Preterm AB TAB SAB Ectopic Multiple Living   2 1 1  1  1   1       Review of Systems  Psychiatric/Behavioral: Positive for suicidal ideas and dysphoric mood. The patient is nervous/anxious.    All other systems reviewed and are negative.   Allergies  Review of patient's allergies indicates no known allergies.  Home Medications   Prior to Admission medications   Medication Sig Start Date End Date Taking? Authorizing Provider  Aspirin-Acetaminophen-Caffeine (GOODY HEADACHE PO) Take 2 Packages by mouth daily as needed (headache).   Yes Historical Provider, MD  ibuprofen (ADVIL,MOTRIN) 200 MG tablet Take 400-600 mg by mouth every 6 (six) hours as needed for mild pain (depends on pain level).   Yes Historical Provider, MD   BP 128/64 mmHg  Pulse 94  Temp(Src) 97.7 F (36.5 C) (Oral)  Resp 16  Ht 5\' 10"  (1.778 m)  Wt 81.647 kg  BMI 25.83 kg/m2  SpO2 100%  LMP 08/04/2015   Physical Exam  Constitutional: She is oriented to person, place, and time. She appears well-developed and well-nourished. No distress.  HENT:  Head: Normocephalic and atraumatic.  Eyes: Conjunctivae and EOM are normal. No scleral icterus.  Neck: Normal range of motion.  Pulmonary/Chest: Effort normal. No respiratory distress.  Musculoskeletal: Normal range of motion.  Neurological: She is alert and oriented to person, place, and time.  Skin: Skin is warm and dry. No rash noted. She is not diaphoretic. No erythema. No pallor.  Psychiatric: Her mood appears anxious. Her speech is rapid and/or pressured. She is agitated. She expresses no homicidal ideation. She expresses no homicidal plans.  Depression with thoughts of not wanting to be alive at times. Denies suicidal ideations stating, "I would never kill myself".  Nursing note and vitals reviewed.   ED Course  Procedures (including critical care time) DIAGNOSTIC STUDIES: Oxygen Saturation is 99% on RA, normal by my interpretation.    COORDINATION OF CARE: 11:53 PM-Discussed treatment plan which includes Psych evaluation with pt at bedside and pt agreed to plan.   Labs Review Labs Reviewed  COMPREHENSIVE METABOLIC PANEL - Abnormal; Notable for  the following:    ALT 105 (*)    All other components within normal limits  ETHANOL - Abnormal; Notable for the following:    Alcohol, Ethyl (B) 90 (*)    All other components within normal limits  ACETAMINOPHEN LEVEL - Abnormal; Notable for the following:    Acetaminophen (Tylenol), Serum <10 (*)    All other components within normal limits  URINE RAPID DRUG SCREEN, HOSP PERFORMED - Abnormal; Notable for the following:    Opiates POSITIVE (*)    Cocaine POSITIVE (*)    Amphetamines POSITIVE (*)    All other components within normal limits  SALICYLATE LEVEL  CBC  I-STAT BETA HCG BLOOD, ED ([facility], [facility], [facility] ONLY)    Imaging Review No results found.    EKG Interpretation None      MDM   Final diagnoses:  Bipolar depression (HCC)    30 year old female percent to the emergency department hoping to get back on her psychiatric medications. She reports a history of bipolar depression and has been off of her Wellbutrin and Adderall for 2 years. She has been self-medicating with illicit substances. Triage note reports suicidal ideations; however, the patient denies any suicidal ideation during my encounter with her. She is able to contract for safety. She has been evaluated by TTS who do not see indication for inpatient management. Patient has been given a list of resources for outpatient use. Return precautions discussed and provided. Patient discharged in satisfactory condition with no unaddressed concerns.  I personally performed the services described in this documentation, which was scribed in my presence. The recorded information has been reviewed and is accurate.        Antony MaduraKelly Demontrez Rindfleisch, PA-C 08/09/15 0138  Paula LibraJohn Molpus, MD 08/09/15 [phone]

## 2015-08-08 NOTE — ED Notes (Signed)
Pt's boyfriend and friend wanded by security and are visiting with patient.

## 2015-08-08 NOTE — ED Notes (Signed)
Pt wanded by security and placed in paper scrubs.  Belongings bagged and labeled and secured behind the nurses station.  Pt went to restroom just prior to coming to triage and is unable to provide a urine specimen at this time.

## 2015-08-09 LAB — COMPREHENSIVE METABOLIC PANEL
ALBUMIN: 4.4 g/dL (ref 3.5–5.0)
ALK PHOS: 75 U/L (ref 38–126)
ALT: 105 U/L — ABNORMAL HIGH (ref 14–54)
AST: 21 U/L (ref 15–41)
Anion gap: 6 (ref 5–15)
BILIRUBIN TOTAL: 0.5 mg/dL (ref 0.3–1.2)
BUN: 14 mg/dL (ref 6–20)
CALCIUM: 9.2 mg/dL (ref 8.9–10.3)
CO2: 24 mmol/L (ref 22–32)
CREATININE: 0.92 mg/dL (ref 0.44–1.00)
Chloride: 107 mmol/L (ref 101–111)
GFR calc Af Amer: >60 mL/min (ref >60)
Glucose, Bld: 96 mg/dL (ref 65–99)
Potassium: 3.8 mmol/L (ref 3.5–5.1)
Sodium: 137 mmol/L (ref 135–145)
TOTAL PROTEIN: 8.1 g/dL (ref 6.5–8.1)

## 2015-08-09 LAB — I-STAT BETA HCG BLOOD, ED (MC, WL, AP ONLY)

## 2015-08-09 LAB — RAPID URINE DRUG SCREEN, HOSP PERFORMED
Amphetamines: POSITIVE — AB
BARBITURATES: NOT DETECTED
BENZODIAZEPINES: NOT DETECTED
Cocaine: POSITIVE — AB
Opiates: POSITIVE — AB
Tetrahydrocannabinol: NOT DETECTED

## 2015-08-09 LAB — ACETAMINOPHEN LEVEL: Acetaminophen (Tylenol), Serum: <10 ug/mL — ABNORMAL LOW (ref 10–30)

## 2015-08-09 LAB — ETHANOL: ALCOHOL ETHYL (B): 90 mg/dL — AB (ref <5)

## 2015-08-09 LAB — CBC
HCT: 38.1 % (ref 36.0–46.0)
Hemoglobin: 12.4 g/dL (ref 12.0–15.0)
MCH: 28.3 pg (ref 26.0–34.0)
MCHC: 32.5 g/dL (ref 30.0–36.0)
MCV: 87 fL (ref 78.0–100.0)
Platelets: 201 10*3/uL (ref 150–400)
RBC: 4.38 MIL/uL (ref 3.87–5.11)
RDW: 14.3 % (ref 11.5–15.5)
WBC: 7 10*3/uL (ref 4.0–10.5)

## 2015-08-09 LAB — SALICYLATE LEVEL: Salicylate Lvl: <4 mg/dL (ref 2.8–30.0)

## 2015-08-09 NOTE — ED Notes (Signed)
Pt requested to speak to doctor about the interest in leaving and not staying.  Per PA and patient agreement, we will wait another 30 minutes for recommendation and make decisions from there.

## 2015-08-09 NOTE — ED Notes (Signed)
Pt ambulatory and independent at discharge.  Understood discharge instructions and need to use resource guide.
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
## 2015-08-09 NOTE — Discharge Instructions (Signed)
Bipolar Disorder °Bipolar disorder is a mental illness. The term bipolar disorder actually is used to describe a group of disorders that all share varying degrees of emotional highs and lows that can interfere with daily functioning, such as work, school, or relationships. Bipolar disorder also can lead to drug abuse, hospitalization, and suicide. °The emotional highs of bipolar disorder are periods of elation or irritability and high energy. These highs can range from a mild form (hypomania) to a severe form (mania). People experiencing episodes of hypomania may appear energetic, excitable, and highly productive. People experiencing mania may behave impulsively or erratically. They often make poor decisions. They may have difficulty sleeping. The most severe episodes of mania can involve having very distorted beliefs or perceptions about the world and seeing or hearing things that are not real (psychotic delusions and hallucinations).  °The emotional lows of bipolar disorder (depression) also can range from mild to severe. Severe episodes of bipolar depression can involve psychotic delusions and hallucinations. °Sometimes people with bipolar disorder experience a state of mixed mood. Symptoms of hypomania or mania and depression are both present during this mixed-mood episode. °SIGNS AND SYMPTOMS °There are signs and symptoms of the episodes of hypomania and mania as well as the episodes of depression. The signs and symptoms of hypomania and mania are similar but vary in severity. They include: °· Inflated self-esteem or feeling of increased self-confidence. °· Decreased need for sleep. °· Unusual talkativeness (rapid or pressured speech) or the feeling of a need to keep talking. °· Sensation of racing thoughts or constant talking, with quick shifts between topics that may or may not be related (flight of ideas). °· Decreased ability to focus or concentrate. °· Increased purposeful activity, such as work, studies,  or social activity, or nonproductive activity, such as pacing, squirming and fidgeting, or finger and toe tapping. °· Impulsive behavior and use of poor judgment, resulting in high-risk activities, such as having unprotected sex or spending excessive amounts of money. °Signs and symptoms of depression include the following:  °· Feelings of sadness, hopelessness, or helplessness. °· Frequent or uncontrollable episodes of crying. °· Lack of feeling anything or caring about anything. °· Difficulty sleeping or sleeping too much.  °· Inability to enjoy the things you used to enjoy.   °· Desire to be alone all the time.   °· Feelings of guilt or worthlessness.  °· Lack of energy or motivation.   °· Difficulty concentrating, remembering, or making decisions.  °· Change in appetite or weight beyond normal fluctuations. °· Thoughts of death or the desire to harm yourself. °DIAGNOSIS  °Bipolar disorder is diagnosed through an assessment by your caregiver. Your caregiver will ask questions about your emotional episodes. There are two main types of bipolar disorder. People with type I bipolar disorder have manic episodes with or without depressive episodes. People with type II bipolar disorder have hypomanic episodes and major depressive episodes, which are more serious than mild depression. The type of bipolar disorder you have can make an important difference in how your illness is monitored and treated. °Your caregiver may ask questions about your medical history and use of alcohol or drugs, including prescription medication. Certain medical conditions and substances also can cause emotional highs and lows that resemble bipolar disorder (secondary bipolar disorder).  °TREATMENT  °Bipolar disorder is a long-term illness. It is best controlled with continuous treatment rather than treatment only when symptoms occur. The following treatments can be prescribed for bipolar disorders: °· Medication--Medication can be prescribed by  a doctor that   is an expert in treating mental disorders (psychiatrists). Medications called mood stabilizers are usually prescribed to help control the illness. Other medications are sometimes added if symptoms of mania, depression, or psychotic delusions and hallucinations occur despite the use of a mood stabilizer.  Talk therapy--Some forms of talk therapy are helpful in providing support, education, and guidance. A combination of medication and talk therapy is best for managing the disorder over time. A procedure in which electricity is applied to your brain through your scalp (electroconvulsive therapy) is used in cases of severe mania when medication and talk therapy do not work or work too slowly.   This information is not intended to replace advice given to you by your health care provider. Make sure you discuss any questions you have with your health care provider.   Document Released: 05/05/2000 Document Revised: 02/17/2014 Document Reviewed: 02/23/2012 Elsevier Interactive Patient Education 2016 [employer]  [employer] CorporationCommunity Resource Guide Outpatient Counseling/Substance Abuse Adult The United Ways 211 is a great source of information about community services available.  Access by dialing 2-1-1 from anywhere in [place], or by website -  PooledIncome.plwww.nc211.org.   Other Local Resources (Updated 02/2015)  Crisis Hotlines   Services     Area Served  [facility]  Crisis Hotline, available 24 hours a day, 7 days a week: [phone] [address] [facility] [place], [place]   [facility]  Crisis Hotline, available 24 hours a day, 7 days a week: [phone] [facility], [place]  [facility]  Suicide Prevention Hotline, available 24 hours a day, 7 days a week: (787) [phone] [facility], [place]  [facility]   Crisis Hotline, available 24 hours a day, 7 days a week: [phone] [facility] [place], [place]   [facility] Access to [employer] Line  Crisis Hotline, available 24  hours a day, 7 days a week: [phone])[phone] All   [facility]  Crisis Hotline, available 24 hours a day, 7 days a week: [phone](857)[phone] All   Other Local Resources (Updated 02/2015)  Outpatient Counseling/ Substance Abuse Programs  Services     Address and Phone Number  ADS ([facility]cohol and Drug Services)   Options include Individual counseling, group counseling, intensive outpatient program (several hours a day, several days a week)  Offers depression assessments  Provides methadone maintenance program [phone] [address], [address] [place], [place] [address]   [facility]   Offers partial hospitalization/day treatment and DUI/DWI programs  [employer]s Medicare, private insurance [phone] [address], [address] [place], [place] [address]  [facility]    Services include intensive outpatient program (several hours a day, several days a week), outpatient treatment, DUI/DWI services, family education  Also has some services specifically for [employer]  Offers transitional housing  [phone] [address] [place], [place] [address]     [facility]  [employer]s Medicare, private pay, and private insurance [phone] [address], [address] [place], [place] [address]  [facility]  Services include individual counseling, substance abuse intensive outpatient program (several hours a day, several days a week), day treatment  Delene Lollccepts Medicare, Medicaid, private insurance [phone] [address], [address] [place], [place] [address]  [facility]   Offers substance abuse intensive outpatient program (several hours a day, several days a week), partial hospitalization program [phone] [address] [place], [place] [address]  [phone] [address] [place], [place] [address]  [phone] [place], [place]  [address]  [phone] [address]  [address] [place], [place] [address]  [facility]  Individual counseling only  Accepts private insurance only [phone] [address] [place], [place] [address]  [facility]  Methadone maintenance program [phone] [address] [place], [place] [address]  [facility]  Walk-In Clinic providing substance abuse and mental health counseling  Accepts Medicaid, Medicare, private insurance  Offers sliding scale for uninsured [phone] [address] [place], [place]   [facility] [place], [employer].  Offers individual counseling, and intensive in-home services [phone] [address], [address] [place], [place] [address]  [facility] individual counseling, family counseling, group therapy, domestic violence counseling, consumer credit counseling  Accepts Medicare, Medicaid, private insurance  Offers sliding scale for uninsured [phone] [address] [place], [place] [address]  [phone] [facility], [address] [place], [place] [address]  [facility]  Offers individual, family and group counseling  3 locations - [place], [place], and [place]  [phone]  [address] [place], [place] [address]  [address] [place], [place] [address]  [address] [place], [place] [address]  [facility]    Offers psychiatric assessment, 8-week Intensive Outpatient Program (several hours a day, several times a week, daytime or evenings), early recovery group, family Program, medication management  Private pay or private insurance only [phone], or  [phone] [address] [place], [place] [address]  [facility] [place] [employer] individual, couples and family counseling  Accepts Medicaid, private insurance, and sliding scale for uninsured [phone] [address] [place], [place] [address]  Len Blalock, MD  Individual  counseling  Private insurance [phone] [address] [place], [place] [address]  [facility]   Offers assessment, substance abuse treatment, and behavioral health treatment [phone] [address] [place], [place] [address]  [facility]  Individual counseling  Accepts private insurance [phone] [address] [place], [place] [address]  [facility]  Individual counseling  Delene Loll, private insurance [phone] [address] [place], [place] [address]  [facility]    Offers intensive outpatient program (several hours a day, several times a week)  Private pay, private insurance [phone] [facility] [place], [place]  [facility]  Individual counseling  Medicare, private insurance [phone] [address] [place], [place] [address]  [facility]    Offers intensive outpatient program (several hours a day, several times a week) and partial hospitalization program [phone] [address] [place], [place] [address]  Emerson Monte, MD  Individual counseling [phone] [address], [address] [place], [place] [address]  [facility]  Offers Christian counseling to individuals, couples, and families  Accepts Medicare and private insurance; offers sliding scale for uninsured [phone] [address] [place], [place] [address]  [facility]  [place] counseling [phone] [address] [place], [place] [address]  [facility] crisis counseling, individual counseling, group therapy, in-home therapy, domestic violence services, day treatment, DWI services, Administrator, arts (CST), Assertive Community Treatment Team (ACTT), substance abuse Intensive Outpatient Program (several hours a day, several times a week)  2  locations - [place] and [place] [phone] [address] [place], [place] [address]  [phone] [address] [place], [place] [address]  [facility]     Individual counseling and group therapy  Accepts private insurance, [place], [place] [phone] [address] [place], [place]  [place]  [facility]  Offers individual and family counseling  Offers LGBTQ services  Accepts private insurance and private pay [phone] [address] [place], [place] [address]  [facility]    Offers individual counseling, group therapy, and outpatient detox  Accepts private insurance [phone] [address] [place], [place]  [facility]  Individual counseling  Accepts Medicare, private insurance [phone] [address] [place], [place] [address]  [facility]  Individual counseling  Accepts Medicare, private insurance [phone] [address] [place], [place] [address]  [facility]   Offers substance abuse Intensive Outpatient Program (several hours a day, several times a week) [phone], or [phone] [place], [place]

## 2015-08-30 DIAGNOSIS — F3132 Bipolar disorder, current episode depressed, moderate: Secondary | ICD-10-CM | POA: Insufficient documentation

## 2015-08-31 DIAGNOSIS — F1994 Other psychoactive substance use, unspecified with psychoactive substance-induced mood disorder: Secondary | ICD-10-CM | POA: Insufficient documentation

## 2016-09-10 DIAGNOSIS — F17210 Nicotine dependence, cigarettes, uncomplicated: Secondary | ICD-10-CM | POA: Diagnosis not present

## 2016-09-10 DIAGNOSIS — F1510 Other stimulant abuse, uncomplicated: Secondary | ICD-10-CM | POA: Diagnosis not present

## 2016-09-10 DIAGNOSIS — R51 Headache: Secondary | ICD-10-CM | POA: Diagnosis not present

## 2016-09-10 DIAGNOSIS — L02511 Cutaneous abscess of right hand: Secondary | ICD-10-CM | POA: Diagnosis not present

## 2016-09-10 DIAGNOSIS — D61818 Other pancytopenia: Secondary | ICD-10-CM | POA: Diagnosis not present

## 2016-09-10 DIAGNOSIS — B9620 Unspecified Escherichia coli [E. coli] as the cause of diseases classified elsewhere: Secondary | ICD-10-CM | POA: Diagnosis not present

## 2016-09-10 DIAGNOSIS — N390 Urinary tract infection, site not specified: Secondary | ICD-10-CM

## 2016-09-10 DIAGNOSIS — F1110 Opioid abuse, uncomplicated: Secondary | ICD-10-CM | POA: Diagnosis not present

## 2016-09-10 DIAGNOSIS — Z5329 Procedure and treatment not carried out because of patient's decision for other reasons: Secondary | ICD-10-CM | POA: Diagnosis not present

## 2016-09-10 DIAGNOSIS — A419 Sepsis, unspecified organism: Secondary | ICD-10-CM

## 2016-09-10 DIAGNOSIS — F1410 Cocaine abuse, uncomplicated: Secondary | ICD-10-CM | POA: Diagnosis not present

## 2016-09-10 DIAGNOSIS — L03113 Cellulitis of right upper limb: Secondary | ICD-10-CM | POA: Diagnosis not present

## 2016-09-11 DIAGNOSIS — L03113 Cellulitis of right upper limb: Secondary | ICD-10-CM | POA: Diagnosis not present

## 2016-09-11 DIAGNOSIS — N390 Urinary tract infection, site not specified: Secondary | ICD-10-CM | POA: Diagnosis not present

## 2016-09-11 DIAGNOSIS — A419 Sepsis, unspecified organism: Secondary | ICD-10-CM | POA: Diagnosis not present

## 2016-09-11 DIAGNOSIS — D61818 Other pancytopenia: Secondary | ICD-10-CM | POA: Diagnosis not present

## 2016-09-11 DIAGNOSIS — R509 Fever, unspecified: Secondary | ICD-10-CM | POA: Diagnosis not present

## 2016-09-12 DIAGNOSIS — L03113 Cellulitis of right upper limb: Secondary | ICD-10-CM | POA: Diagnosis not present

## 2016-09-12 DIAGNOSIS — N390 Urinary tract infection, site not specified: Secondary | ICD-10-CM | POA: Diagnosis not present

## 2016-09-12 DIAGNOSIS — A419 Sepsis, unspecified organism: Secondary | ICD-10-CM | POA: Diagnosis not present

## 2016-09-12 DIAGNOSIS — D61818 Other pancytopenia: Secondary | ICD-10-CM | POA: Diagnosis not present

## 2017-04-10 DIAGNOSIS — Z87898 Personal history of other specified conditions: Secondary | ICD-10-CM

## 2017-04-10 HISTORY — DX: Personal history of other specified conditions: Z87.898

## 2017-05-11 DIAGNOSIS — T8484XA Pain due to internal orthopedic prosthetic devices, implants and grafts, initial encounter: Secondary | ICD-10-CM

## 2017-05-11 HISTORY — DX: Pain due to internal orthopedic prosthetic devices, implants and grafts, initial encounter: T84.84XA

## 2017-06-04 ENCOUNTER — Other Ambulatory Visit: Payer: Self-pay

## 2017-06-04 ENCOUNTER — Encounter (HOSPITAL_BASED_OUTPATIENT_CLINIC_OR_DEPARTMENT_OTHER): Payer: Self-pay | Admitting: *Deleted

## 2017-06-04 NOTE — Pre-Procedure Instructions (Signed)
To come for CMET and urine preg.

## 2017-06-08 ENCOUNTER — Encounter (HOSPITAL_BASED_OUTPATIENT_CLINIC_OR_DEPARTMENT_OTHER)
Admission: RE | Admit: 2017-06-08 | Discharge: 2017-06-08 | Disposition: A | Discharge status: Home / Self Care | Payer: Medicaid Other | Source: Ambulatory Visit | Attending: Orthopedic Surgery | Admitting: Orthopedic Surgery

## 2017-06-08 DIAGNOSIS — Z01812 Encounter for preprocedural laboratory examination: Secondary | ICD-10-CM | POA: Insufficient documentation

## 2017-06-08 LAB — COMPREHENSIVE METABOLIC PANEL
ALBUMIN: 4.1 g/dL (ref 3.5–5.0)
ALK PHOS: 53 U/L (ref 38–126)
ALT: 13 U/L — ABNORMAL LOW (ref 14–54)
AST: 18 U/L (ref 15–41)
Anion gap: 9 (ref 5–15)
BUN: 15 mg/dL (ref 6–20)
CALCIUM: 8.8 mg/dL — AB (ref 8.9–10.3)
CO2: 22 mmol/L (ref 22–32)
Chloride: 101 mmol/L (ref 101–111)
Creatinine, Ser: 0.73 mg/dL (ref 0.44–1.00)
GFR calc non Af Amer: >60 mL/min (ref >60)
Glucose, Bld: 99 mg/dL (ref 65–99)
Potassium: 5.5 mmol/L — ABNORMAL HIGH (ref 3.5–5.1)
SODIUM: 132 mmol/L — AB (ref 135–145)
Total Bilirubin: 0.6 mg/dL (ref 0.3–1.2)
Total Protein: 7.2 g/dL (ref 6.5–8.1)

## 2017-06-08 LAB — POCT PREGNANCY, URINE: Preg Test, Ur: NEGATIVE

## 2017-06-09 NOTE — Progress Notes (Signed)
Lab results reviewed by Dr. Miguel Rota, K+-5.5, will obtain ISTAT morning of surgery.

## 2017-06-11 ENCOUNTER — Ambulatory Visit (HOSPITAL_BASED_OUTPATIENT_CLINIC_OR_DEPARTMENT_OTHER): Admission: RE | Admit: 2017-06-11 | Payer: Self-pay | Source: Ambulatory Visit | Admitting: Orthopedic Surgery

## 2017-06-11 HISTORY — DX: Personal history of other specified conditions: Z87.898

## 2017-06-11 HISTORY — DX: Pain due to internal orthopedic prosthetic devices, implants and grafts, initial encounter: T84.84XA

## 2017-06-11 HISTORY — DX: Unspecified viral hepatitis C without hepatic coma: B19.20

## 2017-06-11 SURGERY — REMOVAL, HARDWARE
Anesthesia: General | Laterality: Left

## 2018-10-31 DIAGNOSIS — Z8679 Personal history of other diseases of the circulatory system: Secondary | ICD-10-CM

## 2019-08-14 DIAGNOSIS — F1120 Opioid dependence, uncomplicated: Secondary | ICD-10-CM | POA: Insufficient documentation

## 2019-08-15 DIAGNOSIS — F1320 Sedative, hypnotic or anxiolytic dependence, uncomplicated: Secondary | ICD-10-CM | POA: Insufficient documentation

## 2019-08-15 DIAGNOSIS — F1010 Alcohol abuse, uncomplicated: Secondary | ICD-10-CM | POA: Insufficient documentation

## 2020-05-21 ENCOUNTER — Ambulatory Visit: Payer: Medicaid Other | Admitting: Physician Assistant

## 2020-05-21 ENCOUNTER — Other Ambulatory Visit: Payer: Self-pay

## 2020-05-21 VITALS — BP 122/68 | HR 97 | Temp 98.2°F | Resp 18 | Ht 70.0 in | Wt 157.0 lb

## 2020-05-21 DIAGNOSIS — F3132 Bipolar disorder, current episode depressed, moderate: Secondary | ICD-10-CM

## 2020-05-21 DIAGNOSIS — L03113 Cellulitis of right upper limb: Secondary | ICD-10-CM

## 2020-05-21 DIAGNOSIS — L03114 Cellulitis of left upper limb: Secondary | ICD-10-CM

## 2020-05-21 MED ORDER — MUPIROCIN CALCIUM 2 % EX CREA: 1.0000 "application " | TOPICAL_CREAM | Freq: Two times a day (BID) | CUTANEOUS | 0 refills | Status: AC

## 2020-05-21 MED ORDER — CEPHALEXIN 500 MG PO CAPS: 500.0000 mg | ORAL_CAPSULE | Freq: Four times a day (QID) | ORAL | 0 refills | Status: AC

## 2020-05-21 NOTE — Progress Notes (Signed)
Patient verified DOB Patient has not taken medication today Patient complains of abscess on right arm elbow. Patient reports cleaning dog feces for 2 days in a basement and noticed bumps beginning on her hands. Patient popped the pimples and then began to have green discharge from area on elbow and wrist.

## 2020-05-21 NOTE — Patient Instructions (Addendum)
You will take keflex 4 times a day for the next 10 days.  You will use the bactroban cream twice a day as well.  Please let us know if there is anything else we can do for you  Roney Jaffe, PA-C Physician Assistant [facility] [email or web address]    Skin Abscess  A skin abscess is an infected area on or under your skin that contains a collection of pus and other material. An abscess may also be called a furuncle, carbuncle, or boil. An abscess can occur in or on almost any part of your body. Some abscesses break open (rupture) on their own. Most continue to get worse unless they are treated. The infection can spread deeper into the body and eventually into your blood, which can make you feel ill. Treatment usually involves draining the abscess. What are the causes? An abscess occurs when germs, like bacteria, pass through your skin and cause an infection. This may be caused by:  A scrape or cut on your skin.  A puncture wound through your skin, including a needle injection or insect bite.  Blocked oil or sweat glands.  Blocked and infected hair follicles.  A cyst that forms beneath your skin (sebaceous cyst) and becomes infected. What increases the risk? This condition is more likely to develop in people who:  Have a weak body defense system (immune system).  Have diabetes.  Have dry and irritated skin.  Get frequent injections or use illegal IV drugs.  Have a foreign body in a wound, such as a splinter.  Have problems with their lymph system or veins. What are the signs or symptoms? Symptoms of this condition include:  A painful, firm bump under the skin.  A bump with pus at the top. This may break through the skin and drain. Other symptoms include:  Redness surrounding the abscess site.  Warmth.  Swelling of the lymph nodes (glands) near the abscess.  Tenderness.  A sore on the skin. How is this  diagnosed? This condition may be diagnosed based on:  A physical exam.  Your medical history.  A sample of pus. This may be used to find out what is causing the infection.  Blood tests.  Imaging tests, such as an ultrasound, CT scan, or MRI. How is this treated? A small abscess that drains on its own may not need treatment. Treatment for larger abscesses may include:  Moist heat or heat pack applied to the area several times a day.  A procedure to drain the abscess (incision and drainage).  Antibiotic medicines. For a severe abscess, you may first get antibiotics through an IV and then change to antibiotics by mouth. Follow these instructions at home: Medicines  Take over-the-counter and prescription medicines only as told by your health care provider.  If you were prescribed an antibiotic medicine, take it as told by your health care provider. Do not stop taking the antibiotic even if you start to feel better.   Abscess care  If you have an abscess that has not drained, apply heat to the affected area. Use the heat source that your health care provider recommends, such as a moist heat pack or a heating pad. ? Place a towel between your skin and the heat source. ? Leave the heat on for 20-30 minutes. ? Remove the heat if your skin turns bright red. This is especially important if you are unable to feel pain, heat, or cold. You may have a greater risk  of getting burned.  Follow instructions from your health care provider about how to take care of your abscess. Make sure you: ? Cover the abscess with a bandage (dressing). ? Change your dressing or gauze as told by your health care provider. ? Wash your hands with soap and water before you change the dressing or gauze. If soap and water are not available, use hand sanitizer.  Check your abscess every day for signs of a worsening infection. Check for: ? More redness, swelling, or pain. ? More fluid or blood. ? Warmth. ? More pus  or a bad smell.   General instructions  To avoid spreading the infection: ? Do not share personal care items, towels, or hot tubs with others. ? Avoid making skin contact with other people.  Keep all follow-up visits as told by your health care provider. This is important. Contact a health care provider if you have:  More redness, swelling, or pain around your abscess.  More fluid or blood coming from your abscess.  Warm skin around your abscess.  More pus or a bad smell coming from your abscess.  A fever.  Muscle aches.  Chills or a general ill feeling. Get help right away if you:  Have severe pain.  See red streaks on your skin spreading away from the abscess. Summary  A skin abscess is an infected area on or under your skin that contains a collection of pus and other material.  A small abscess that drains on its own may not need treatment.  Treatment for larger abscesses may include having a procedure to drain the abscess and taking an antibiotic. This information is not intended to replace advice given to you by your health care provider. Make sure you discuss any questions you have with your health care provider. Document Revised: 05/20/2018 Document Reviewed: 03/12/2017 Elsevier Patient Education  2021 [employer].

## 2020-05-21 NOTE — Progress Notes (Signed)
New Patient Office Visit  Subjective:  Patient ID: Laurence Aly, female    DOB: [birth year]  Age: 35 y.o. MRN: [id number]  CC:  Chief Complaint  Patient presents with  . Abscess    HPI [place] reports that she had an abscess beginning on her right elbow 2 days ago.  Reports it is very painful to touch, states 2 days ago she did have a measured fever, however does not remember the temperature.  Reports that she has had some drainage coming out of it.  Has been keeping it covered with a bandage.  Reports that she has  open wounds on both hands, and one on her right outer wrist.  States that she has been out of her medications for the past couple of months, states that she has a behavioral health provider in [place], but has not made an appointment recently.  Reports that she does not have a primary care provider in [place], states that she will go to the emergency room if she has any needs.     Past Medical History:  Diagnosis Date  . ADHD (attention deficit hyperactivity disorder)    no current med.  . Anxiety   . Depression   . Hepatitis C   . History of seizure 04/2017   x 1 - during withdrawal from heroin  . Painful orthopaedic hardware [facility]) 05/2017   left ankle    Past Surgical History:  Procedure Laterality Date  . CESAREAN SECTION    . KNEE ARTHROSCOPY Left   . ORIF ANKLE FRACTURE Left 01/24/2009    Family History  Problem Relation Age of Onset  . Diabetes Mother     Social History   Socioeconomic History  . Marital status: Single    Spouse name: Not on file  . Number of children: Not on file  . Years of education: Not on file  . Highest education level: Not on file  Occupational History  . Not on file  Tobacco Use  . Smoking status: Former Smoker    Packs/day: 0.00    Quit date: 05/29/2017    Years since quitting: 2.9  . Smokeless tobacco: Never Used  Vaping Use  . Vaping Use: Never used  Substance and Sexual Activity  .  Alcohol use: Not Currently  . Drug use: Not Currently    Types: IV  . Sexual activity: Yes    Birth control/protection: None  Other Topics Concern  . Not on file  Social History Narrative  . Not on file   Social Determinants of Health   Financial Resource Strain: Not on file  Food Insecurity: Not on file  Transportation Needs: Not on file  Physical Activity: Not on file  Stress: Not on file  Social Connections: Not on file  Intimate Partner Violence: Not on file    ROS Review of Systems  Constitutional: Negative for chills and fever.  HENT: Negative.   Eyes: Negative.   Respiratory: Negative for shortness of breath.   Cardiovascular: Negative for chest pain.  Gastrointestinal: Negative.   Endocrine: Negative.   Genitourinary: Negative.   Musculoskeletal: Negative.   Skin: Positive for wound.  Allergic/Immunologic: Negative.   Neurological: Negative for weakness and headaches.  Hematological: Negative.   Psychiatric/Behavioral: Negative.     Objective:   Today's Vitals: BP 122/68 (BP Location: Left Arm, Patient Position: Sitting, Cuff Size: Normal)   Pulse 97   Temp 98.2 F (36.8 C) (Oral)   Resp 18  Ht 5\' 10"  (1.778 m)   Wt 157 lb (71.2 kg)   SpO2 99%   BMI 22.53 kg/m   Physical Exam Vitals and nursing note reviewed.  Constitutional:      Appearance: Normal appearance.     Comments: unkept  HENT:     Head: Normocephalic and atraumatic.     Right Ear: External ear normal.     Left Ear: External ear normal.     Nose: Nose normal.     Mouth/Throat:     Mouth: Mucous membranes are moist.     Pharynx: Oropharynx is clear.  Eyes:     Extraocular Movements: Extraocular movements intact.     Conjunctiva/sclera: Conjunctivae normal.     Pupils: Pupils are equal, round, and reactive to light.  Cardiovascular:     Rate and Rhythm: Normal rate and regular rhythm.     Pulses: Normal pulses.     Heart sounds: Normal heart sounds.  Pulmonary:     Effort:  Pulmonary effort is normal.     Breath sounds: Normal breath sounds.  Musculoskeletal:        General: Normal range of motion.     Cervical back: Normal range of motion and neck supple.  Skin:    Comments: See photos  Neurological:     General: No focal deficit present.     Mental Status: She is alert and oriented to person, place, and time.  Psychiatric:        Mood and Affect: Mood normal.        Behavior: Behavior normal.        Thought Content: Thought content normal.        Judgment: Judgment normal.         Assessment & Plan:   Problem List Items Addressed This Visit      Other   Bipolar affective disorder, currently depressed, moderate (HCC)    Other Visit Diagnoses    Cellulitis of right upper extremity    -  Primary   Relevant Medications   cephALEXin (KEFLEX) 500 MG capsule   mupirocin cream (BACTROBAN) 2 %   Cellulitis of left wrist          Outpatient Encounter Medications as of 05/21/2020  Medication Sig  . cephALEXin (KEFLEX) 500 MG capsule Take 1 capsule (500 mg total) by mouth 4 (four) times daily for 10 days.  . divalproex (DEPAKOTE ER) 500 MG 24 hr tablet Take 1 tablet by mouth daily.  . ergocalciferol (VITAMIN D2) 1.25 MG (50000 UT) capsule Take 1 capsule by mouth once a week.  . iron polysaccharides (NIFEREX) 150 MG capsule Take 1 capsule by mouth daily.  . magnesium oxide (MAG-OX) 400 MG tablet Take 1 tablet by mouth daily.  . Multiple Vitamin (QUINTABS) TABS Take 1 tablet by mouth daily.  . mupirocin cream (BACTROBAN) 2 % Apply 1 application topically 2 (two) times daily.   No facility-administered encounter medications on file as of 05/21/2020.   1. Cellulitis of right upper extremity Patient with history of IV drug use.  Attempted to perform I&D on abscess right elbow, patient was unable to tolerate the first 2 needlesticks for numbing and refused to continue.  Patient encouraged to use warm compresses, trial Keflex.  Red flags given for prompt  reevaluation     Note from hospitalization August 14, 2019:  Patient is a 35 y/o f with history of depression and polysubstance abuse who presented to the ED for opioid detox. Psychiatry  consulted for opioid detox. On interview patient was somnolent but oriented and cooperative. She endorses IV use of heroin and methamphetamine with multiple overdoses, including last night, as well as benzo use. She endorses current withdrawal symptoms of generalized extremity pain and fatigue; denies SI/HI; endorses occasional AVH not currently being experienced. Patient requires inpatient psychiatric hospitalization for management and stabilization due to substance dependence requiring level 4 detoxification. Will admit patient to behavioral health, and start on Subutex and Librium taper. Given bilateral hand edema, history of reusing needles, and history of endocarditis, Hospitalist was consulted due to increased risk of endocarditis and bacteremia.       - cephALEXin (KEFLEX) 500 MG capsule; Take 1 capsule (500 mg total) by mouth 4 (four) times daily for 10 days.  Dispense: 40 capsule; Refill: 0 - mupirocin cream (BACTROBAN) 2 %; Apply 1 application topically 2 (two) times daily.  Dispense: 15 g; Refill: 0  2. Cellulitis of left wrist   3. Bipolar affective disorder, currently depressed, moderate (HCC) Patient encouraged to follow-up with [facility] as soon as possible.  Patient understands and agrees.  Patient would like to establish care at [facility] at [facility], patient given appointment with Delfin Gant on July 24, 2020.  Patient declined lab draw.   I have reviewed the patient's medical history (PMH, PSH, Social History, Family History, Medications, and allergies) , and have been updated if relevant. I spent 36 minutes reviewing chart and  face to face time with patient.     Follow-up: Return in about 2 months (around 07/24/2020) for with Ricky Stabs, NP at [facility].   Kasandra Knudsen  [place], PA-C

## 2020-05-23 DIAGNOSIS — L03113 Cellulitis of right upper limb: Secondary | ICD-10-CM | POA: Insufficient documentation

## 2020-05-23 DIAGNOSIS — L03114 Cellulitis of left upper limb: Secondary | ICD-10-CM | POA: Insufficient documentation

## 2020-07-23 NOTE — Progress Notes (Signed)
Patient did not show for appointment.   

## 2020-07-24 ENCOUNTER — Other Ambulatory Visit: Payer: Self-pay

## 2020-07-24 ENCOUNTER — Encounter: Payer: Medicaid Other | Admitting: Family
